# Patient Record
Sex: Male | Born: 1980
Health system: Southern US, Community
[De-identification: ages and names within clinical notes are randomized; demographics above are authoritative.]

## PROBLEM LIST (undated history)

## (undated) DIAGNOSIS — J42 Unspecified chronic bronchitis: Secondary | ICD-10-CM

## (undated) DIAGNOSIS — K824 Cholesterolosis of gallbladder: Secondary | ICD-10-CM

## (undated) DIAGNOSIS — G43909 Migraine, unspecified, not intractable, without status migrainosus: Secondary | ICD-10-CM

## (undated) DIAGNOSIS — N410 Acute prostatitis: Secondary | ICD-10-CM

## (undated) DIAGNOSIS — H00019 Hordeolum externum unspecified eye, unspecified eyelid: Secondary | ICD-10-CM

## (undated) DIAGNOSIS — B2790 Infectious mononucleosis, unspecified without complication: Secondary | ICD-10-CM

## (undated) DIAGNOSIS — J45909 Unspecified asthma, uncomplicated: Secondary | ICD-10-CM

## (undated) DIAGNOSIS — J302 Other seasonal allergic rhinitis: Secondary | ICD-10-CM

## (undated) DIAGNOSIS — K7689 Other specified diseases of liver: Secondary | ICD-10-CM

## (undated) DIAGNOSIS — K760 Fatty (change of) liver, not elsewhere classified: Secondary | ICD-10-CM

## (undated) DIAGNOSIS — E669 Obesity, unspecified: Secondary | ICD-10-CM

## (undated) DIAGNOSIS — F329 Major depressive disorder, single episode, unspecified: Secondary | ICD-10-CM

## (undated) DIAGNOSIS — F32A Depression, unspecified: Secondary | ICD-10-CM

## (undated) HISTORY — PX: WISDOM TOOTH EXTRACTION: SHX21

## (undated) HISTORY — DX: Acute prostatitis: N41.0

## (undated) HISTORY — DX: Unspecified asthma, uncomplicated: J45.909

## (undated) HISTORY — DX: Unspecified chronic bronchitis: J42

## (undated) HISTORY — DX: Infectious mononucleosis, unspecified without complication: B27.90

## (undated) HISTORY — DX: Hordeolum externum unspecified eye, unspecified eyelid: H00.019

## (undated) HISTORY — PX: OTHER SURGICAL HISTORY: SHX169

---
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
## 2001-10-25 ENCOUNTER — Encounter (INDEPENDENT_AMBULATORY_CARE_PROVIDER_SITE_OTHER): Payer: Self-pay | Admitting: Specialist

## 2001-10-25 ENCOUNTER — Ambulatory Visit (HOSPITAL_BASED_OUTPATIENT_CLINIC_OR_DEPARTMENT_OTHER): Admission: RE | Admit: 2001-10-25 | Discharge: 2001-10-25 | Payer: Self-pay | Admitting: Plastic Surgery

## 2005-09-23 ENCOUNTER — Ambulatory Visit: Payer: Self-pay | Admitting: Internal Medicine

## 2006-11-04 ENCOUNTER — Ambulatory Visit: Payer: Self-pay | Admitting: Internal Medicine

## 2006-12-03 ENCOUNTER — Encounter: Payer: Self-pay | Admitting: Internal Medicine

## 2007-11-17 ENCOUNTER — Ambulatory Visit: Payer: Self-pay | Admitting: Internal Medicine

## 2007-11-17 DIAGNOSIS — J42 Unspecified chronic bronchitis: Secondary | ICD-10-CM | POA: Insufficient documentation

## 2007-11-17 DIAGNOSIS — F17200 Nicotine dependence, unspecified, uncomplicated: Secondary | ICD-10-CM | POA: Insufficient documentation

## 2007-11-17 DIAGNOSIS — E669 Obesity, unspecified: Secondary | ICD-10-CM | POA: Insufficient documentation

## 2007-11-21 ENCOUNTER — Telehealth: Payer: Self-pay | Admitting: Internal Medicine

## 2008-02-28 ENCOUNTER — Ambulatory Visit: Payer: Self-pay | Admitting: Internal Medicine

## 2008-05-14 ENCOUNTER — Telehealth (INDEPENDENT_AMBULATORY_CARE_PROVIDER_SITE_OTHER): Payer: Self-pay | Admitting: *Deleted

## 2008-05-15 ENCOUNTER — Ambulatory Visit: Payer: Self-pay | Admitting: Internal Medicine

## 2008-09-07 ENCOUNTER — Telehealth: Payer: Self-pay | Admitting: Internal Medicine

## 2008-10-25 ENCOUNTER — Ambulatory Visit: Payer: Self-pay | Admitting: Internal Medicine

## 2009-04-12 ENCOUNTER — Ambulatory Visit: Payer: Self-pay | Admitting: Internal Medicine

## 2009-04-12 ENCOUNTER — Encounter: Payer: Self-pay | Admitting: Internal Medicine

## 2009-04-12 ENCOUNTER — Telehealth: Payer: Self-pay | Admitting: Internal Medicine

## 2009-04-12 DIAGNOSIS — S59919A Unspecified injury of unspecified forearm, initial encounter: Secondary | ICD-10-CM

## 2009-04-12 DIAGNOSIS — S6990XA Unspecified injury of unspecified wrist, hand and finger(s), initial encounter: Secondary | ICD-10-CM | POA: Insufficient documentation

## 2009-04-12 DIAGNOSIS — S59909A Unspecified injury of unspecified elbow, initial encounter: Secondary | ICD-10-CM

## 2009-11-13 ENCOUNTER — Ambulatory Visit: Payer: Self-pay | Admitting: Internal Medicine

## 2010-02-25 NOTE — Assessment & Plan Note (Signed)
Summary: elbow injury / SD   Vital Signs:  Patient profile:   30 year old male Height:      68 inches (172.72 cm) Weight:      232.75 pounds (105.80 kg) BMI:     35.52 O2 Sat:      98 % on Room air Temp:     97.5 degrees F (36.39 degrees C) oral Pulse rate:   55 / minute Pulse rhythm:   regular Resp:     16 per minute BP sitting:   120 / 78  (left arm) Cuff size:   large  Vitals Entered By: Rock Nephew CMA (April 12, 2009 11:16 AM) Taken by Sydell Axon  Nutrition Counseling: Patient's BMI is greater than 25 and therefore counseled on weight management options.  O2 Flow:  Room air CC: pt c/o pain in R elbow from a fall, limiter ROM Is Patient Diabetic? No   Primary Care Provider:  Jacques Navy MD  CC:  pt c/o pain in R elbow from a fall and limiter ROM.  History of Present Illness:  Injury      This is a 30 year old man who presents with An injury.  The problem began 12-24 hrs ago.  The patient reports injury to the right elbow.  He feel playing kickball and landed on his right elbow. The patient also reports tenderness.  The patient denies swelling, redness, increased warmth deformity, blood loss, numbness, weakness, loss of sensation, coolness of extremity, and loss of consciousness.  The patient denies the following risk factors for significant bleeding: aspirin use, anticoagulant use, and history of bleeding disorder.  Screening for risk of abuse was negative.    Preventive Screening-Counseling & Management  Alcohol-Tobacco     Alcohol drinks/day: 1     Alcohol type: beer     Smoking Status: quit < 6 months     Smoking Cessation Counseling: yes     Packs/Day: .5     Tobacco Counseling: to remain off tobacco products  Medications Prior to Update: 1)  Chantix Starting Month Pak 0.5 Mg X 11 & 1 Mg X 42 Tabs (Varenicline Tartrate) .... As Directed: Two Times A Day Stepping Up The Dose 2)  Chantix Continuing Month Pak 1 Mg Tabs (Varenicline Tartrate) ....  Take As Directed: 1 Tab Two Times A Day.  Current Medications (verified): 1)  Chantix Continuing Month Pak 1 Mg Tabs (Varenicline Tartrate) .... Take As Directed: 1 Tab Two Times A Day.  Allergies (verified): No Known Drug Allergies  Past History:  Past Medical History: Reviewed history from 11/17/2007 and no changes required. Hx of BRONCHITIS, CHRONIC (ICD-491.9)  Past Surgical History: Reviewed history from 11/17/2007 and no changes required. mole excision scalp and arm  Family History: Reviewed history from 11/17/2007 and no changes required. Father - healthy with no active medical problems, CAD, DM, HTN Mother - Crohn's disease, Raynaud's phenomena Neg - prostate or colon cancer, DM  Social History: Reviewed history from 05/15/2008 and no changes required. 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 quit smoking March 2010  Review of Systems MS:  Complains of joint pain and stiffness; denies joint redness, joint swelling, loss of strength, muscle aches, and thoracic pain.  Physical Exam  General:  Well-developed,well-nourished,in no acute distress; alert,appropriate and cooperative throughout examination Neck:  supple, full ROM, no masses, no thyromegaly; no thyroid nodules  or tenderness. no JVD or carotid bruits.   Lungs:  Normal respiratory effort, chest expands symmetrically. Lungs are clear to auscultation, no crackles or wheezes. Heart:  normal rate, regular rhythm, no murmur, and no rub. BLE without edema. normal DP pulses and normal cap refill in all 4 extremities    Msk:  his right elbow has a very small, subtle abrasion just distal to the olecranon process. there is minimal discomfort with palpation and full extension but there is good ROM otherwise. the joint is not swollen and does not have any crepitance. Pulses:  R and L  carotid,radial,femoral,dorsalis pedis and posterior tibial pulses are full and equal bilaterally Extremities:  No clubbing, cyanosis, edema, or deformity noted with normal full range of motion of all joints.   Neurologic:  No cranial nerve deficits noted. Station and gait are normal. Plantar reflexes are down-going bilaterally. DTRs are symmetrical throughout. Sensory, motor and coordinative functions appear intact.   Impression & Recommendations:  Problem # 1:  ELBOW INJURY (ICD-959.3) Assessment New  Orders: T-Elbow Comp Right (73080TC)  Complete Medication List: 1)  Chantix Continuing Month Pak 1 Mg Tabs (Varenicline tartrate) .... Take as directed: 1 tab two times a day.  Patient Instructions: 1)  Please schedule a follow-up appointment in 2 weeks. 2)  Take 650-1000mg  of Tylenol every 4-6 hours as needed for relief of pain or comfort of fever AVOID taking more than 4000mg   in a 24 hour period (can cause liver damage in higher doses). 3)  Take 400-600mg  of Ibuprofen (Advil, Motrin) with food every 4-6 hours as needed for relief of pain or comfort of fever. 4)  You may move around but avoid painful motions. Apply ice to sore area for 20 minutes 3-4 times a day for 2-3 days.

## 2010-02-25 NOTE — Assessment & Plan Note (Signed)
Summary: FLU [facility] Nat[facility] Milch  Nurse Visit   Allergies: No Known Drug Allergies  Orders Added: 1)  Admin 1st Vaccine [90471] 2)  Flu Vaccine 34yrs + [13086] .lbflu     Flu Vaccine Consent Questions     Do you have a history of severe allergic reactions to this vaccine? no    Any prior history of allergic reactions to egg and/or gelatin? no    Do you have a sensitivity to the preservative Thimersol? no    Do you have a past history of Guillan-Barre Syndrome? no    Do you currently have an acute febrile illness? no    Have you ever had a severe reaction to latex? no    Vaccine information given and explained to patient? yes    Are you currently pregnant? no    Lot Number:[id number]   Exp Date:07/26/2010   Site Given  Left Deltoid IM Lanier Prude, [facility])  November 13, 2009 1:57 PM

## 2010-02-25 NOTE — Progress Notes (Signed)
  Phone Note Other Incoming   Summary of Call: Pt has 2 EMR/IDX chart numbers. Emr will be notified to merge the charts. This must be the # used and xray order was put in EMR & IDX. (will also result to this MRN) Initial call taken by: Lamar Sprinkles, CMA,  April 12, 2009 11:33 AM  New Problems: ELBOW INJURY (ICD-959.3)   New Problems: ELBOW INJURY (ICD-959.3)

## 2010-02-25 NOTE — Miscellaneous (Signed)
Summary: [facility]   Imported By: Lester Blair 04/19/2009 09:49:57  _____________________________________________________________________  External Attachment:    Type:   Image     Comment:   External Document

## 2010-06-13 NOTE — Assessment & Plan Note (Signed)
[facility]                             PRIMARY CARE OFFICE NOTE   NAME:[name] [name]                     MRN:          [id number]  DATE:09/23/2005                            DOB:          09-27-1980    [name] [name] is a 30 year old Caucasian male who I saw for routine medical  care.  He has not been seen for some time.  He presents with a several day  history of low-grade fever, cough productive of a yellow, thick phlegm.  Mild shortness of breath.  He does continue to smoke.  He has taken no  medications for this.   REVIEW OF SYSTEMS:  Negative for constitutional, cardiovascular,  respiratory, GI problems except as noted above.   PHYSICAL EXAMINATION:  VITAL SIGNS:  Temperature 99.3, blood pressure  129/81, pulse 88.  Weight 214.  GENERAL APPEARANCE:  A heavy set Caucasian male in no acute distress.  HEENT:  Left TM at the superior anterior aspect was mildly erythematous.  CHEST:  Patient is moving air well.  No rales, wheezes, or rhonchi are  appreciated.  There is no increased work of breathing.   IMPRESSION/PLAN:  Smoker's bronchitis:  Patient is started on azithromycin  500 mg t.i.d., Phenergan w/Codeine 1 teaspoon q.6h. for cough.  Patient is  to use Tylenol for fever.  I have asked him to call [phone] when he is  ready to make this effort.   Patient reports that he has had labs for routine health maintenance within  the last 2-3 years.  I have reminded him that this should be checked every  five years.  I have encouraged him to continue with his exercise program  which right now consists of road-biking.  He will return to see me on an as-  needed basis.                                   Rosalyn Gess Norins, MD   MEN/MedQ  DD:  09/23/2005  DT:  09/23/2005  Job #:  [id number]

## 2010-06-13 NOTE — Op Note (Signed)
   [name] [name], [name] [name]                     ACCOUNT NO.:  [id number]   MEDICAL RECORD NO.:  [id number]                   PATIENT TYPE:  AMB   LOCATION:  [facility]                                  FACILITY:  [facility]   PHYSICIAN:  Alfredia Ferguson, M.D.               DATE OF BIRTH:  1980/12/22   DATE OF PROCEDURE:  10/25/2001  DATE OF DISCHARGE:                                 OPERATIVE REPORT   PREOPERATIVE DIAGNOSIS:  Dysplastic nevus, left anterior parietal scalp.   POSTOPERATIVE DIAGNOSIS:  Dysplastic nevus, left anterior parietal scalp.   PROCEDURE:  Excision, dysplastic nevus, left parietal scalp, excisional  diameter 1 cm.   SURGEON:  Alfredia Ferguson, M.D.   ANESTHESIA:  2% Xylocaine and 1:100,000 epinephrine.   INDICATION FOR PROCEDURE:  This is a 30 year old male with a previous  history of dysplastic nevus on the left parietal scalp.  There is a new  pigmented nevus located just at the anterior edge of the old scar.  It is  about 4-5 mm in front of his old scar and appears to be a separate lesion as  opposed to a recurrent lesion.  It was biopsied by Dr. Nicholas Lose.  It is  recommended that this area be excised with clear margins.   DESCRIPTION OF PROCEDURE:  The area for surgery was shaved.  Skin marks were  placed in elliptical fashion with approximately 3 mm margins.  Local  anesthesia was infiltrated and the area was prepped with Betadine and draped  with sterile drapes.  After waiting approximately 10 minutes, an elliptical  excision of the lesion down to the level of the galea was carried out.  The  specimen was passed off for pathology.  Hemostasis was accomplished using  pressure.  The wound was closed using multiple interrupted 3-0 nylon  sutures.  The patient was discharged to home in the care of his mother and  father.                                               Alfredia Ferguson, M.D.    WBB/MEDQ  D:  10/25/2001  T:  10/26/2001  Job:  [id number]   cc:   Venancio Poisson, M.D.

## 2011-04-15 IMAGING — CR DG ELBOW COMPLETE 3+V*R*
4 series · 4 of 4 positions shown · non-contrast
Comparison: None.

CLINICAL DATA: Elbow injury last night

RIGHT ELBOW - COMPLETE 3+ VIEW

[view not recorded (1 of 4)]
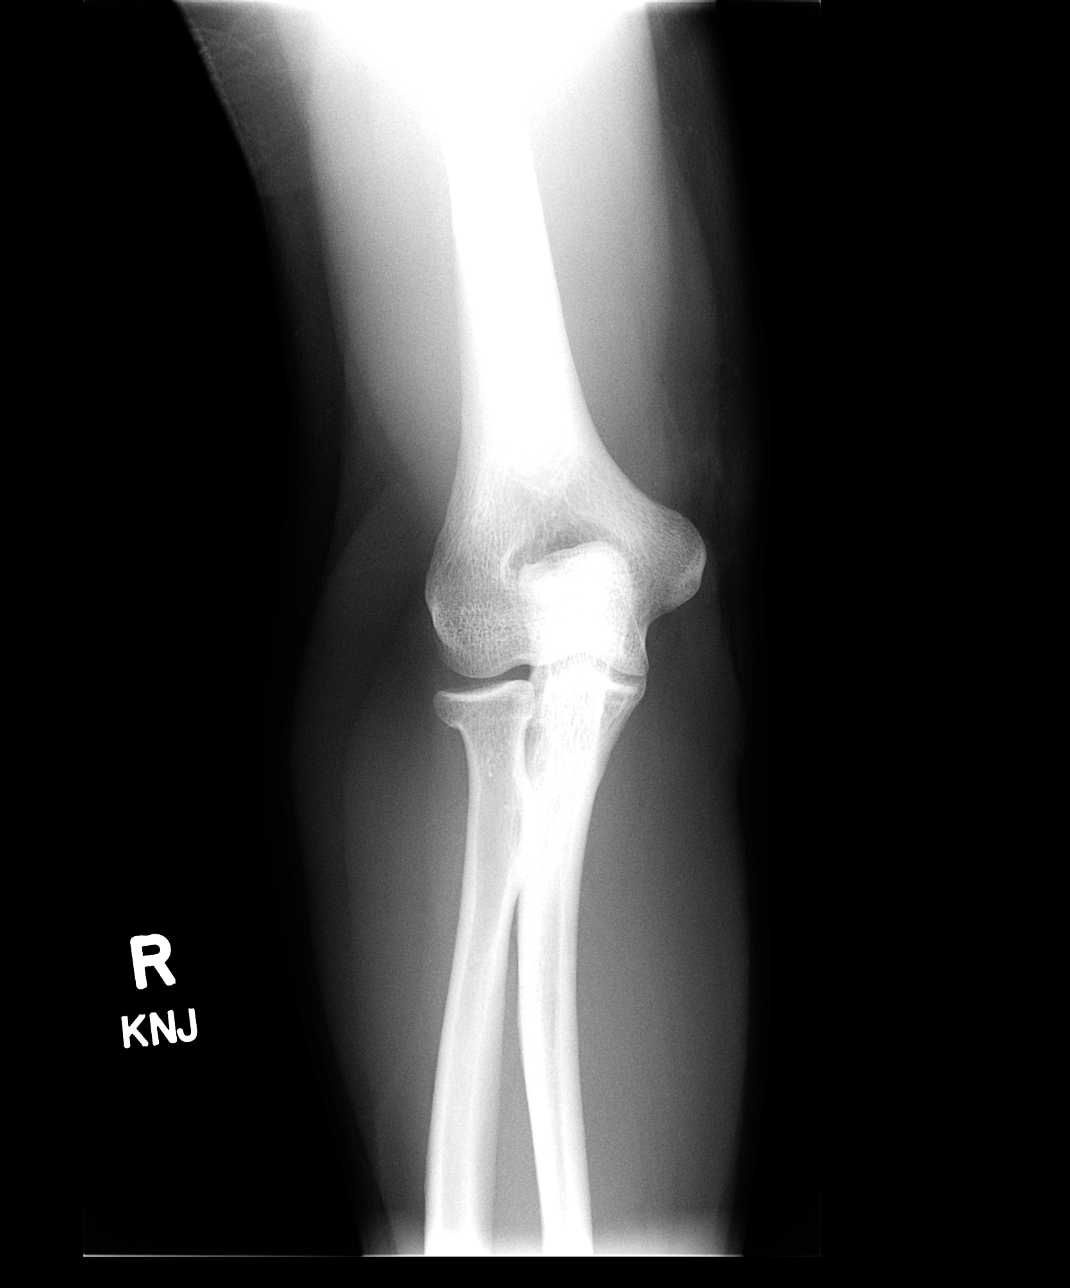

[view not recorded (2 of 4)]
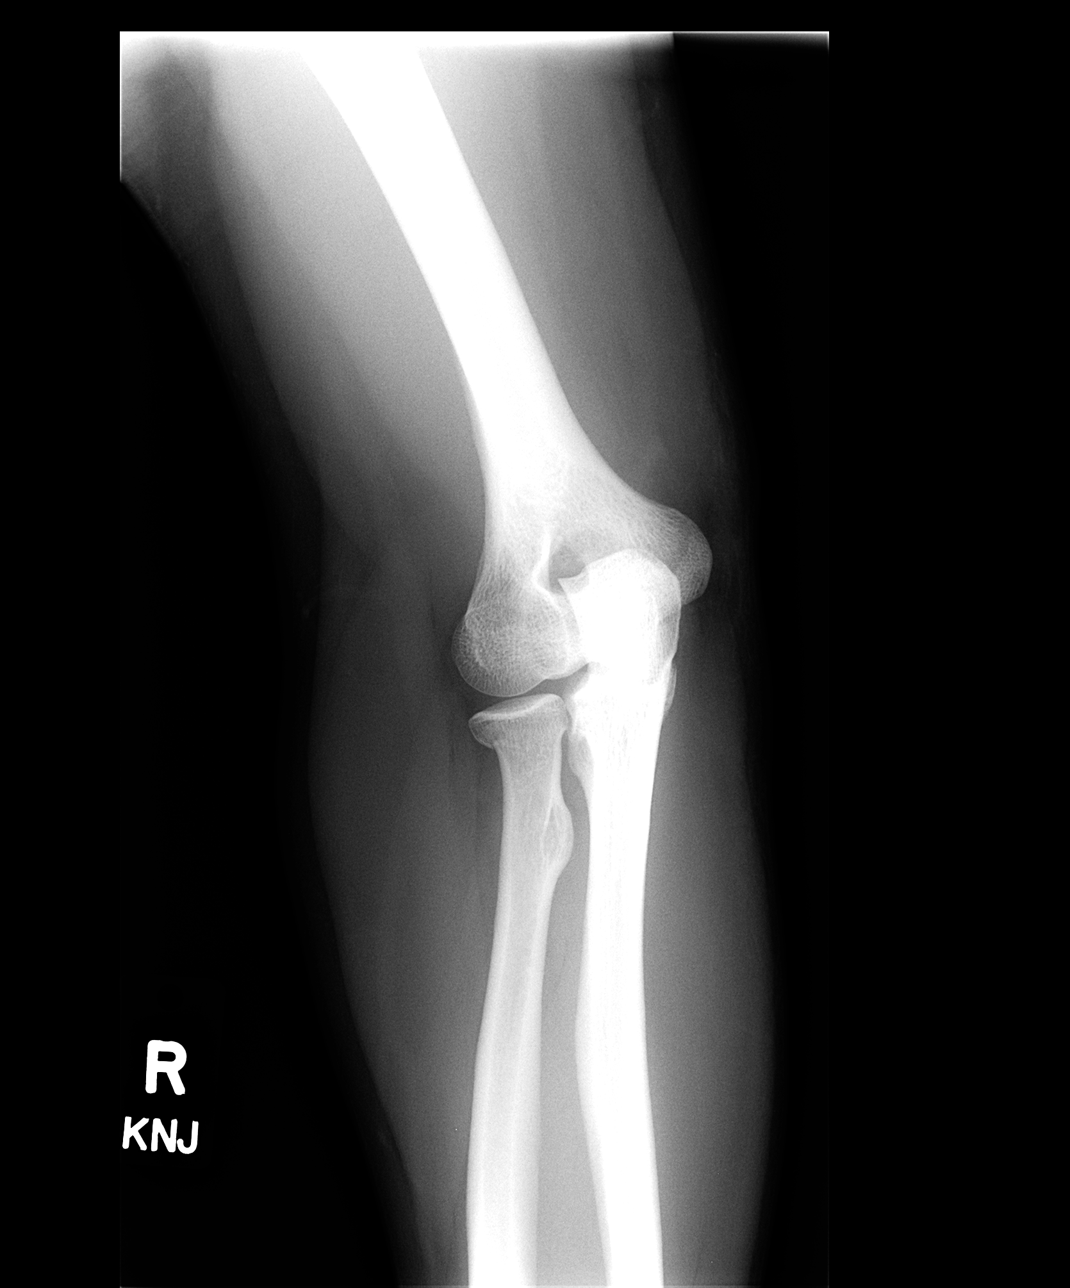

[view not recorded (3 of 4)]
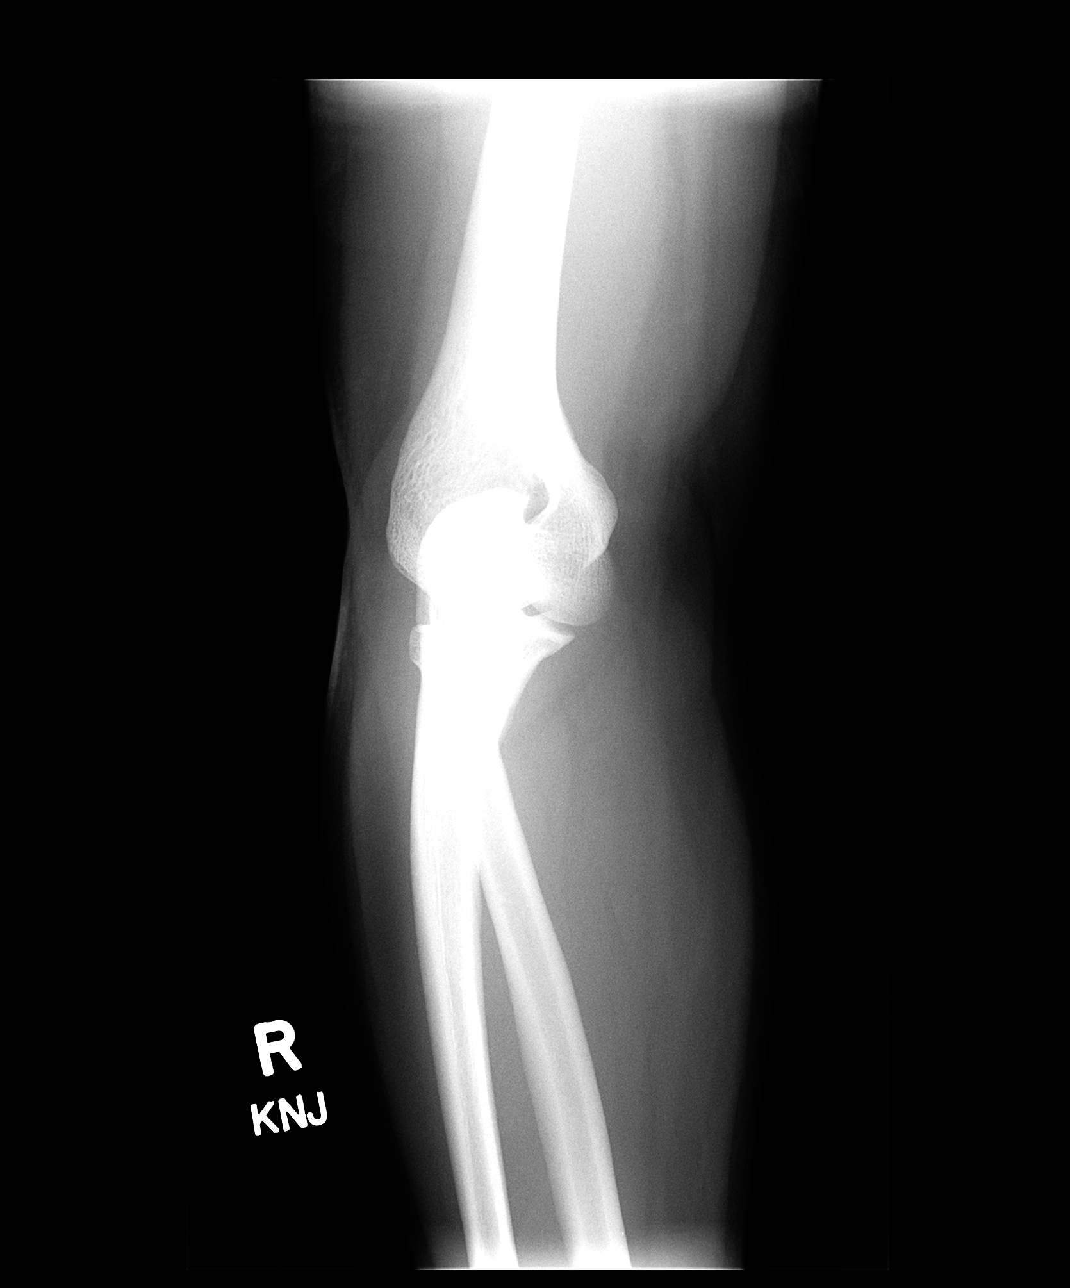

[view not recorded (4 of 4)]
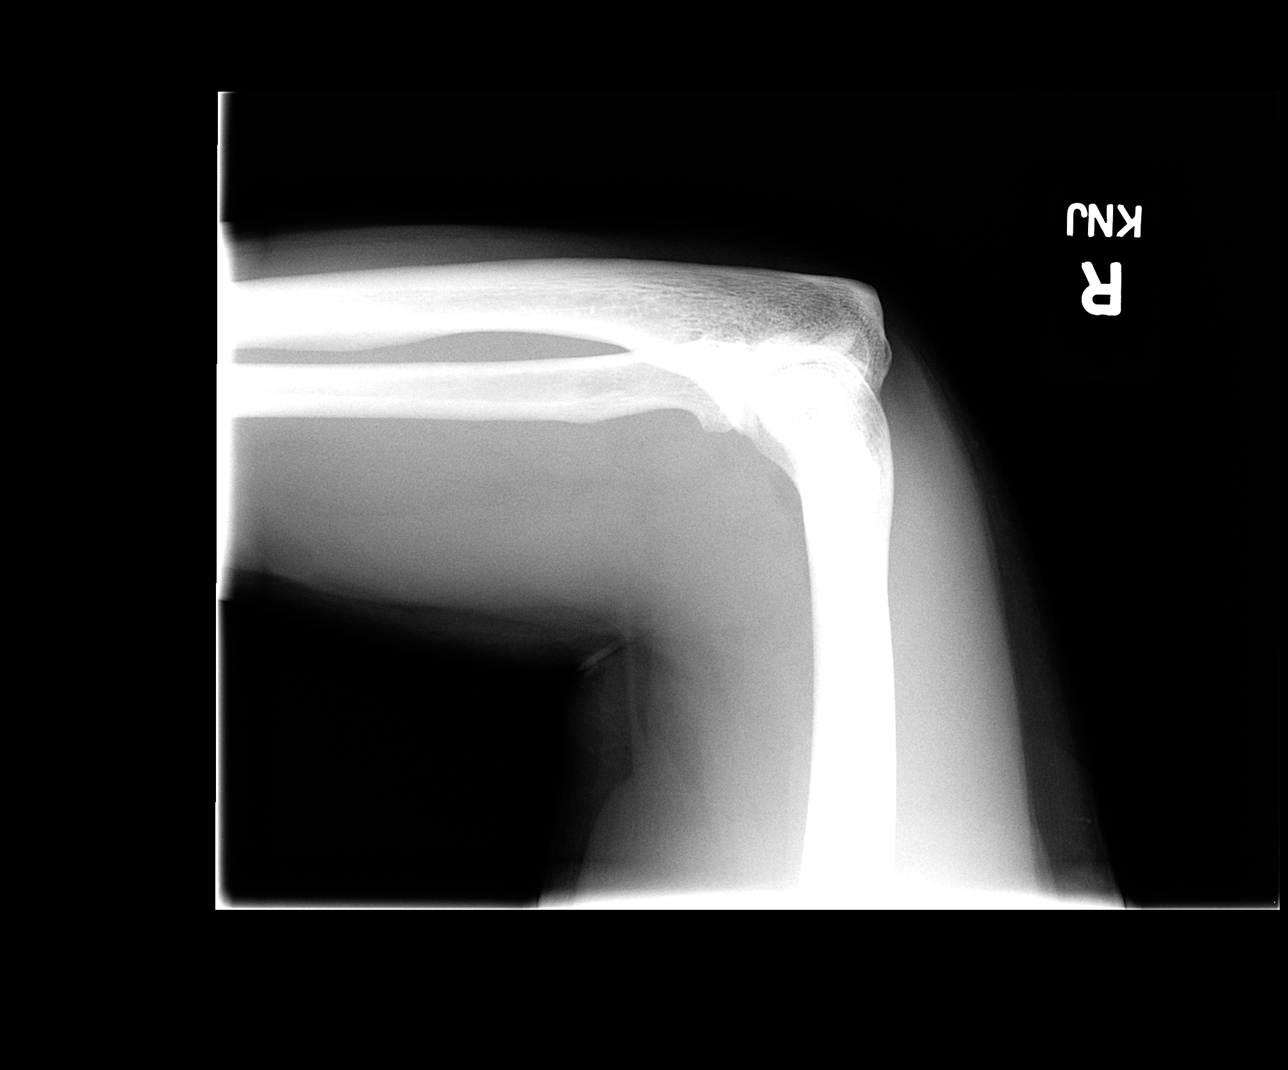

[4 of 4 positions shown; findings below may reference images not displayed]

FINDINGS: Four views of the right elbow submitted.  No acute
fracture or subluxation.  No posterior fat pad sign.
IMPRESSION: No acute fracture or subluxation.

## 2011-04-22 ENCOUNTER — Encounter: Payer: Self-pay | Admitting: Internal Medicine

## 2011-04-22 ENCOUNTER — Telehealth: Payer: Self-pay | Admitting: *Deleted

## 2011-04-22 DIAGNOSIS — N410 Acute prostatitis: Secondary | ICD-10-CM

## 2011-04-22 MED ORDER — HYDROCODONE-ACETAMINOPHEN 5-325 MG PO TABS: 1.0000 | ORAL_TABLET | Freq: Four times a day (QID) | ORAL | Status: AC | PRN

## 2011-04-22 MED ORDER — PROMETHAZINE HCL 25 MG RE SUPP: 25.0000 mg | Freq: Four times a day (QID) | RECTAL | Status: AC | PRN

## 2011-04-22 MED ORDER — SULFAMETHOXAZOLE-TRIMETHOPRIM 800-160 MG PO TABS: 1.0000 | ORAL_TABLET | Freq: Two times a day (BID) | ORAL | Status: AC

## 2011-04-22 MED ORDER — PROMETHAZINE HCL 12.5 MG PO TABS: 12.5000 mg | ORAL_TABLET | Freq: Three times a day (TID) | ORAL | Status: AC | PRN

## 2011-04-22 NOTE — Telephone Encounter (Signed)
Patient c/o abdominal pain x5 hours and requesting OV for tomorrow/SLS

## 2011-04-22 NOTE — Patient Instructions (Signed)
Acute prostatitis - classic presentation. Plan - septra DS twice a day for 14 days; for nausea phenergan 25 mg suppository every 6 hours, once the nausea is controlled take oral phenergan 12.5 mg every 6 hours until nausea is gone and you can take it as needed. For pain - will start with hydrocodone/APAP 5/325 every 6 hours for two or three doses then try tylenol alone 1000mg  three times a day. Clear liquids, eat when you feel better. Call if you don't do better with this regimen   [phone].   Blood pressure was way too high - 180/100. This needs close follow up.  Prostatitis Prostatitis is an inflammation (the body's way of reacting to injury and/or infection) of the prostate gland. The prostate gland is a male organ. The gland is about the size and shape of a walnut. The prostate is located just below the bladder. It produces semen, which is a fluid that helps nourish and transport sperm. Prostatitis is the most common urinary tract problem in men younger than age 41. There are 4 categories of prostatitis:  I - Acute bacterial prostatitis.   II - Chronic bacterial prostatitis.   III - Chronic prostatitis and chronic pelvic pain syndrome (CPPS).   Inflammatory.   Non inflammatory.   IV - Asymptomatic inflammatory prostatitis.  Acute and chronic bacterial prostatitis are problems with bacterial infections of the prostate. "Acute" infection is usually a one-time problem. "Chronic" bacterial prostatitis is a condition with recurrent infection. It is usually caused by the same germ(bacteria). CPPS has symptoms similar to prostate infection. However, no infection is actually found. This condition can cause problems of ongoing pain. Currently, it cannot be cured. Treatments are available and aimed at symptom control.   Asymptomatic inflammatory prostatitis has no symptoms. It is a condition where infection-fighting cells are found by chance in the urine. The diagnosis is made most often during an  exam for other conditions. Other conditions could be infertility or a high level of PSA (prostate-specific antigen) in the blood. SYMPTOMS   Symptoms can vary depending upon the type of prostatitis that exists. There can also be overlap in symptoms. This can make diagnosis difficult. Symptoms: For Acute bacterial prostatitis  Painful urination.   Fever or chills.   Muscle or joint pains.   Low back pain.   Low abdominal pain.   Inability to empty bladder completely.   Sudden urges to urinate.   Frequent urination during the day.   Difficulty starting urine stream.   Need to urinate several times at night (nocturia).   Weak urine stream.   Urethral (tube that carries urine from the bladder out of the body) discharge and dribbling after urination.  For Chronic bacterial prostatitis  Rectal pain.   Pain in the testicles, penis, or tip of the penis.   Pain in the space between the anus and scrotum (perineum).   Low back pain.   Low abdominal pain.   Problems with sexual function.   Painful ejaculation.   Bloody semen.   Inability to empty bladder completely.   Painful urination.   Sudden urges to urinate.   Frequent urination during the day.   Difficulty starting urine stream.   Need to urinate several times at night (nocturia).   Weak urine stream.   Dribbling after urination.   Urethral discharge.  For Chronic prostatitis and chronic pelvic pain syndrome (CPPS) Symptoms are the same as those for chronic bacterial prostatitis. Problems with sexual function are often the reason  for seeking care. This important problem should be discussed with your caregiver. For Asymptomatic inflammatory prostatitis As noted above, there are no symptoms with this condition. DIAGNOSIS    Your caregiver may perform a rectal exam. This exam is to determine if the prostate is swollen and tender.   Sometimes blood work is performed. This is done to see if your white blood  cell count is elevated. The Prostate Specific Antigen (PSA) is also measured. PSA is a blood test that can help detect early prostate cancer.   A urinalysis is done to find out what type of infection is present if this is a suspected cause. An additional urinalysis may be done after a digital rectal exam. This is to see if white blood cells are pushed out of the prostate and into the urine. A low-grade infection of the prostate may not be found on the first urinalysis.  In more difficult cases, your caregiver may advise other tests. Tests could include:  Urodynamics -- Tests the function of the bladder and the organs involved in triggering and controlling normal urination.   Urine flow rate.   Cystoscopy -- In this procedure, a thin, telescope-like tube with a light and tiny camera attached (cystoscope) is inserted into the bladder through the urethra. This allows the caregiver to see the inside of the urethra and bladder.   Electromyography -- This procedure tests how the muscles and nerves of the bladder work. It is focused on the muscles that control the anus and pelvic floor. These are the muscles between the anus and scrotum.  In people who show no signs of infection, certain uncommon infections might be causing constant or recurrent symptoms. These uncommon infections are difficult to detect. More work in medicine may help find solutions to these problems. TREATMENT   Antibiotics are used to treat infections caused by germs. If the infection is not treated and becomes long lasting (chronic), it may become a lower grade infection with minor, continual problems. Without treatment, the prostate may develop a boil or furuncle (abscess). This may require surgical treatment. For those with chronic prostatitis and CPPS, it is important to work closely with your primary caregiver and urologist. For some, the medicines that are used to treat a non-cancerous, enlarged prostate (benign prostatic  hypertrophy) may be helpful. Referrals to specialists other than urologists may be necessary. In rare cases when all treatments have been inadequate for pain control, an operation to remove the prostate may be recommended. This is very rare and before this is considered thorough discussion with your urologist is highly recommended.   In cases of secondary to chronic non-bacterial prostatitis, a good relationship with your urologist or primary caregiver is essential because it is often a recurrent prolonged condition that requires a good understanding of the causes and a commitment to therapy aimed at controlling your symptoms. HOME CARE INSTRUCTIONS    Hot sitz baths for 20 minutes, 4 times per day, may help relieve pain.   Non-prescription pain killers may be used as your caregiver recommends if you have no allergies to them. Some illnesses or conditions prevent use of non-prescription drugs. If unsure, check with your caregiver. Take all medications as directed. Take the antibiotics for the prescribed length of time, even if you are feeling better.  SEEK MEDICAL CARE IF:    You have any worsening of the symptoms that originally brought you to your caregiver.   You have an oral temperature above 102 F (38.9 C).  You experience any side effects from medications prescribed.  SEEK IMMEDIATE MEDICAL CARE IF:    You have an oral temperature above 102 F (38.9 C), not controlled by medicine.   You have pain not relieved with medications.   You develop nausea, vomiting, lightheadedness, or have a fainting episode.   You are unable to urinate.   You pass bloody urine or clots.  Document Released: 01/10/2000 Document Revised: 01/01/2011 Document Reviewed: 12/15/2010 [facility] Patient Information 2012 [place], [place].

## 2011-04-22 NOTE — Telephone Encounter (Signed)
Called patient - told to come to office now.

## 2011-04-23 NOTE — Progress Notes (Signed)
  Subjective:    Patient ID: [name] [name], male    DOB: 08-03-1980, 31 y.o.   MRN: [id number]  HPI Mr. Years reports the onset mid-day of lower abdominal/groin pain, ache, chills, low back pain and nausea. This has been accelerating to the point where he rates his pain as a 8/10. He is asked to come to the office for evaluation.  Past Medical History: Reviewed history from 11/17/2007 and no changes required. Hx of BRONCHITIS, CHRONIC (ICD-491.9)  Past Surgical History: Reviewed history from 11/17/2007 and no changes required. mole excision scalp and arm  Family History: Reviewed history from 11/17/2007 and no changes required. Father - healthy with no active medical problems, CAD, DM, HTN Mother - Crohn's disease, Raynaud's phenomena Neg - prostate or colon cancer, DM  Social History: Reviewed history from 05/15/2008 and no changes required. 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 quit smoking March 2010     Review of Systems System review is negative for any constitutional, cardiac, pulmonary, GI or neuro symptoms or complaints other than as described in the HPI.     Objective:   Physical Exam t 98.3  BP 180/100  HR 80 R 14 Gen'l - overweight white man who is a little pale and looks sick HEENT - C&S clear Cor - 2+ radial , RRR Pulm - normal respirations - no wheeze Abd- BS+, no guarding on exam GU - low back tenderness to percussion, very tender to palpation in the lower abdominal quadrants, Prostate - very warm and boggy and tender.       Assessment & Plan:  Acute prostatitis  Plan - Septra DS bid x 14 days           Phenergan supp or tab q 6 prn           Vicodin 5/325 q 6 # 4           APAP 1000 mg tid           Hysdrate.

## 2011-11-02 ENCOUNTER — Other Ambulatory Visit (INDEPENDENT_AMBULATORY_CARE_PROVIDER_SITE_OTHER): Payer: 59

## 2011-11-02 ENCOUNTER — Encounter: Payer: Self-pay | Admitting: Internal Medicine

## 2011-11-02 ENCOUNTER — Ambulatory Visit (INDEPENDENT_AMBULATORY_CARE_PROVIDER_SITE_OTHER): Payer: 59 | Admitting: Internal Medicine

## 2011-11-02 VITALS — BP 106/68 | HR 60 | Temp 98.2°F | Resp 16 | Wt 229.0 lb

## 2011-11-02 DIAGNOSIS — Z0000 Encounter for general adult medical examination without abnormal findings: Secondary | ICD-10-CM

## 2011-11-02 DIAGNOSIS — Z23 Encounter for immunization: Secondary | ICD-10-CM

## 2011-11-02 DIAGNOSIS — E663 Overweight: Secondary | ICD-10-CM

## 2011-11-02 DIAGNOSIS — Z720 Tobacco use: Secondary | ICD-10-CM

## 2011-11-02 DIAGNOSIS — F17200 Nicotine dependence, unspecified, uncomplicated: Secondary | ICD-10-CM

## 2011-11-02 LAB — LIPID PANEL
Cholesterol: 173 mg/dL (ref 0–200)
HDL: 36.7 mg/dL — ABNORMAL LOW (ref >39.00)
LDL Cholesterol: 102 mg/dL — ABNORMAL HIGH (ref 0–99)
Triglycerides: 170 mg/dL — ABNORMAL HIGH (ref 0.0–149.0)
VLDL: 34 mg/dL (ref 0.0–40.0)

## 2011-11-02 LAB — COMPREHENSIVE METABOLIC PANEL
AST: 34 U/L (ref 0–37)
Alkaline Phosphatase: 58 U/L (ref 39–117)
BUN: 13 mg/dL (ref 6–23)
Creatinine, Ser: 1 mg/dL (ref 0.4–1.5)

## 2011-11-02 MED ORDER — VARENICLINE TARTRATE 0.5 MG PO TABS: 0.5000 mg | ORAL_TABLET | Freq: Two times a day (BID) | ORAL | Status: DC

## 2011-11-02 MED ORDER — VARENICLINE TARTRATE 1 MG PO TABS: 1.0000 mg | ORAL_TABLET | Freq: Two times a day (BID) | ORAL | Status: DC

## 2011-11-02 NOTE — Patient Instructions (Addendum)
You look like you are doing well.   Smoking cessation - THE MOST IMPORTANT THING YOU CAN DO FOR YOUR HEALTH. It is imperative that you do the home work first: you have got to know those times when the temptation to smoke will be the greatest and you need to have an alternative behavior. Once the homework is done and you set the quit date you can start the Chantix 2 weeks before stopping.

## 2011-11-02 NOTE — Progress Notes (Signed)
Subjective:    Patient ID: [name] [name], male    DOB: 1980/10/21, 31 y.o.   MRN: [id number]  HPI [name] [name] presents for a wellness exam. In the interval since last seen his prostatitis completely resolved. He did sprain his right ankle 5 days ago but is doing betting and is able to bear weight and ambulate. His wife is expecting and he is more determined to stop smoking - currently at 3/4 pack/day. He is otherwise in good health. He works out with a Psychologist, educational 2 days a week. He is current with his dentist and has had an eye exam in the last 3 years.   Past Medical History  Diagnosis Date  . Unspecified chronic bronchitis     long resolved. ? related to smoking  . Prostatitis, acute Spring '13    resolved  . Current smoker Oct '13    motivated to quit   Past Surgical History  Procedure Date  . Mole excision    Family History  Problem Relation Age of Onset  . Crohn's disease Mother    History   Social History  . Marital Status: Married    Spouse Name: N/A    Number of Children: N/A  . Years of Education: 15   Occupational History  . property developmen/construction    Social History Main Topics  . Smoking status: Current Every Day Smoker  . Smokeless tobacco: Former Neurosurgeon   Comment: smoking cessation: diary, behavior change plan, quit  . Alcohol Use: 7.0 oz/week    14 drink(s) per week     1-2 beers per day  . Drug Use: No  . Sexually Active: Yes -- Male partner(s)   Other Topics Concern  . 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 QUIT SMOKING MARCH 2010 but RESUMED       Review of Systems Constitutional:  Negative for fever, chills, activity change and unexpected weight change.  HEENT:  Negative for hearing loss, ear pain, congestion, neck stiffness and postnasal drip.  Negative for sore throat or swallowing problems. Negative for dental complaints.   Eyes: Negative for vision loss or change in visual acuity.  Respiratory: Negative for chest tightness and wheezing. Negative for DOE.   Cardiovascular: Negative for chest pain or palpitations. No decreased exercise tolerance Gastrointestinal: No change in bowel habit. No bloating or gas. No reflux or indigestion Genitourinary: Negative for urgency, frequency, flank pain and difficulty urinating.  Musculoskeletal: Negative for myalgias, back pain, arthralgias and gait problem. Painful right ankle Neurological: Negative for dizziness, tremors, weakness and headaches.  Hematological: Negative for adenopathy.  Psychiatric/Behavioral: Negative for behavioral problems and dysphoric mood.       Objective:   Physical Exam Filed Vitals:   11/02/11 0950  BP: 106/68  Pulse: 60  Temp: 98.2 F (36.8 C)  Resp: 16   Wt Readings from Last 3 Encounters:  11/02/11 229 lb (103.874 kg)  04/12/09 232 lb 12 oz (105.575 kg)  05/15/08 228 lb 9.6 oz (103.692 kg)   Gen'l: Well nourished well developed white male in no acute distress  HEENT: Head: Normocephalic and atraumatic. Right Ear: External ear normal. EAC/TM nl. Left Ear: External ear normal.  EAC/TM nl. Nose: Nose normal. Mouth/Throat: Oropharynx is clear and moist. Dentition - native, in good repair. No  buccal or palatal lesions. Posterior pharynx clear. Eyes: Conjunctivae and sclera clear. EOM intact. Pupils are equal, round, and reactive to light. Right eye exhibits no discharge. Left eye exhibits no discharge. Neck: Normal range of motion. Neck supple. No JVD present. No tracheal deviation present. No thyromegaly present.  Cardiovascular: Normal rate, regular rhythm, no gallop, no friction rub, no murmur heard.      Quiet precordium. 2+ radial and DP pulses . No carotid bruits Pulmonary/Chest: Effort normal. No respiratory distress or increased WOB, no wheezes, no  rales. No chest wall deformity or CVAT. Abdomen: Soft. Bowel sounds are normal in all quadrants. He exhibits no distension, no tenderness, no rebound or guarding, No heptosplenomegaly  Genitourinary:  deferred Musculoskeletal: Normal range of motion, including right ankle. He exhibits no edema and no tenderness except at the right ankle.       Small and large joints without redness, synovial thickening or deformity. Full range of motion preserved about all small, median and large joints.  Lymphadenopathy:    He has no cervical or supraclavicular adenopathy.  Neurological: He is alert and oriented to person, place, and time. CN II-XII intact. DTRs 2+ and symmetrical biceps, radial and patellar tendons. Cerebellar function normal with no tremor, rigidity, normal gait and station.  Skin: Skin is warm and dry. No rash noted. No erythema.  Psychiatric: He has a normal mood and affect. His behavior is normal. Thought content normal.   Lab Results  Component Value Date   GLUCOSE 84 11/02/2011   CHOL 173 11/02/2011   TRIG 170.0* 11/02/2011   HDL 36.70* 11/02/2011   LDLCALC 102* 11/02/2011   ALT 51 11/02/2011   AST 34 11/02/2011   NA 140 11/02/2011   K 4.7 11/02/2011   CL 105 11/02/2011   CREATININE 1.0 11/02/2011   BUN 13 11/02/2011   CO2 28 11/02/2011           Assessment & Plan:

## 2011-11-03 ENCOUNTER — Encounter: Payer: Self-pay | Admitting: Internal Medicine

## 2011-11-03 DIAGNOSIS — Z0000 Encounter for general adult medical examination without abnormal findings: Secondary | ICD-10-CM | POA: Insufficient documentation

## 2011-11-03 NOTE — Assessment & Plan Note (Signed)
BMI in obesity I category. He is advised to work on [employer] with a goal of BMI 25 through smart food choice, PORTION SIZE CONTROL, regular exercise. Goal is to loose 1 lb per month

## 2011-11-03 NOTE — Assessment & Plan Note (Addendum)
Interval history negative for major illness, except prostatitis, injury or surgery. Physical exam is normal. Lab work is fine with mild elevation in triglycerides amenable to diet therapy alone. He is given Tdap today.  In summary - a very nice young man who is medically stable but needs to 1) quit smoking, 2) loose weight.

## 2011-11-03 NOTE — Assessment & Plan Note (Signed)
Discussed, again, a strategy for smoking cessation: diary, plan for behavior modification, quit date. Provided Rx for Chantix started pack and monthly pack to start once he has done his homework. He is to report back when he has prepared to [facility]

## 2011-11-30 ENCOUNTER — Encounter: Payer: Self-pay | Admitting: Internal Medicine

## 2011-12-04 ENCOUNTER — Telehealth: Payer: Self-pay | Admitting: *Deleted

## 2011-12-04 NOTE — Telephone Encounter (Signed)
Message routed to [facility]

## 2011-12-08 NOTE — Telephone Encounter (Signed)
Letter printed and placed on MD's desk for review

## 2012-06-13 ENCOUNTER — Ambulatory Visit (INDEPENDENT_AMBULATORY_CARE_PROVIDER_SITE_OTHER): Payer: 59 | Admitting: Internal Medicine

## 2012-06-13 ENCOUNTER — Encounter: Payer: Self-pay | Admitting: Internal Medicine

## 2012-06-13 VITALS — BP 128/62 | HR 82 | Temp 98.3°F | Ht 68.0 in | Wt 231.0 lb

## 2012-06-13 DIAGNOSIS — J209 Acute bronchitis, unspecified: Secondary | ICD-10-CM

## 2012-06-13 DIAGNOSIS — G43909 Migraine, unspecified, not intractable, without status migrainosus: Secondary | ICD-10-CM

## 2012-06-13 MED ORDER — AZITHROMYCIN 250 MG PO TABS: ORAL_TABLET | ORAL | Status: DC

## 2012-06-13 MED ORDER — KETOROLAC TROMETHAMINE 30 MG/ML IJ SOLN
30.0000 mg | Freq: Once | INTRAMUSCULAR | Status: AC
  Administered 2012-06-13: 30 mg via INTRAMUSCULAR

## 2012-06-13 NOTE — Progress Notes (Signed)
Subjective:    Patient ID: [name] [name], male    DOB: Jun 16, 1980, 32 y.o.   MRN: [id number]  HPI  Pt present sto the clinic today with c/o a migraine headache. This started 4 days ago. The pain varies to different sides of the head. He is sensitive to light but not sound. He has been able to sleep but it has not gone away. He has been taking Excedrin and ibuprofen. He has no history of migraines in the past. He has had some associated nausea but no vomiting. He also c/o fatigue, cough, chest congestion. He denies fever chills or body aches. He has not smoked in 37 days. He feels like he has a recurrent bout of bronchitis. This has been an intermittent thing for him since he has been smoking.  Review of Systems      Past Medical History  Diagnosis Date  . Unspecified chronic bronchitis     long resolved. ? related to smoking  . Prostatitis, acute Spring '13    resolved  . Current smoker Oct '13    motivated to quit    Current Outpatient Prescriptions  Medication Sig Dispense Refill  . varenicline (CHANTIX CONTINUING MONTH PAK) 1 MG tablet Take 1 tablet (1 mg total) by mouth 2 (two) times daily.  60 tablet  6  . varenicline (CHANTIX) 0.5 MG tablet Take 1 tablet (0.5 mg total) by mouth 2 (two) times daily. As directed: 1 tab qd x 3, 1 0.5 mg tab bid x 4, 1 mg bid  53 tablet  0   No current facility-administered medications for this visit.    No Known Allergies  Family History  Problem Relation Age of Onset  . Crohn's disease Mother     History   Social History  . Marital Status: Married    Spouse Name: N/A    Number of Children: N/A  . Years of Education: 15   Occupational History  . property developmen/construction    Social History Main Topics  . Smoking status: Current Every Day Smoker  . Smokeless tobacco: Former Neurosurgeon     Comment: smoking cessation: diary, behavior change plan, quit  . Alcohol Use: 7.0 oz/week    14 drink(s) per week     Comment: 1-2 beers  per day  . Drug Use: No  . Sexually Active: Yes -- Male partner(s)   Other Topics Concern  . 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 QUIT SMOKING MARCH 2010 but RESUMED     Constitutional: Pt reports migraine. Denies fever, malaise, fatigue, headache or abrupt weight changes.  HEENT: Denies eye pain, eye redness, ear pain, ringing in the ears, wax buildup, runny nose, nasal congestion, bloody nose, or sore throat. Resperiatory: Pt reports cough and chest congestion. Denies shortness of breath. Gastrointestinal: Denies abdominal pain, bloating, constipation, diarrhea or blood in the stool.   Neurological: Denies dizziness, difficulty with memory, difficulty with speech or problems with balance and coordination.   No other specific complaints in a complete review of systems (except as listed in HPI above).  Objective:   Physical Exam   BP 128/62  Pulse 82  Temp(Src) 98.3 F (36.8 C) (Oral)  Ht 5\' 8"  (1.727 m)  Wt 231 lb (104.781 kg)  BMI 35.13  kg/m2  SpO2 96% Wt Readings from Last 3 Encounters:  06/13/12 231 lb (104.781 kg)  11/02/11 229 lb (103.874 kg)  04/12/09 232 lb 12 oz (105.575 kg)    General: Appears his stated age, well developed, well nourished in NAD. HEENT: Head: normal shape and size; Eyes: sclera white, no icterus, conjunctiva pink, PERRLA and EOMs intact; Ears: Tm's gray and intact, normal light reflex; Nose: mucosa pink and moist, septum midline; Throat/Mouth: Teeth present, mucosa pink and moist, no exudate, lesions or ulcerations noted.  Cardiovascular: Normal rate and rhythm. S1,S2 noted.  No murmur, rubs or gallops noted. No JVD or BLE edema. No carotid bruits noted. Pulmonary/Chest: Normal effort and scattered rhonchi in RML and RLL. No respiratory distress. No  wheezes, rales noted.  Abdomen: Soft and nontender. Normal bowel sounds, no bruits noted. No distention or masses noted. Liver, spleen and kidneys non palpable. Musculoskeletal: Normal range of motion. No signs of joint swelling. No difficulty with gait.  Neurological: Alert and oriented. Cranial nerves II-XII intact. Coordination normal. +DTRs bilaterally.       Assessment & Plan:   Migraine secondary to acute bronchitis:  eRx for zpack Delsym and mucinex as needed 30 mg Toradol Im today

## 2012-06-13 NOTE — Addendum Note (Signed)
Addended by: Carin Primrose on: 06/13/2012 04:20 PM   Modules accepted: Orders

## 2012-06-13 NOTE — Patient Instructions (Signed)
Migraine Headache A migraine headache is an intense, throbbing pain on one or both sides of your head. A migraine can last for 30 minutes to several hours. CAUSES  The exact cause of a migraine headache is not always known. However, a migraine may be caused when nerves in the brain become irritated and release chemicals that cause inflammation. This causes pain. SYMPTOMS  Pain on one or both sides of your head.  Pulsating or throbbing pain.  Severe pain that prevents daily activities.  Pain that is aggravated by any physical activity.  Nausea, vomiting, or both.  Dizziness.  Pain with exposure to bright lights, loud noises, or activity.  General sensitivity to bright lights, loud noises, or smells. Before you get a migraine, you may get warning signs that a migraine is coming (aura). An aura may include:  Seeing flashing lights.  Seeing bright spots, halos, or zig-zag lines.  Having tunnel vision or blurred vision.  Having feelings of numbness or tingling.  Having trouble talking.  Having muscle weakness. MIGRAINE TRIGGERS  Alcohol.  Smoking.  Stress.  Menstruation.  Aged cheeses.  Foods or drinks that contain nitrates, glutamate, aspartame, or tyramine.  Lack of sleep.  Chocolate.  Caffeine.  Hunger.  Physical exertion.  Fatigue.  Medicines used to treat chest pain (nitroglycerine), birth control pills, estrogen, and some blood pressure medicines. DIAGNOSIS  A migraine headache is often diagnosed based on:  Symptoms.  Physical examination.  A CT scan or MRI of your head. TREATMENT Medicines may be given for pain and nausea. Medicines can also be given to help prevent recurrent migraines.  HOME CARE INSTRUCTIONS  Only take over-the-counter or prescription medicines for pain or discomfort as directed by your caregiver. The use of long-term narcotics is not recommended.  Lie down in a dark, quiet room when you have a migraine.  Keep a journal  to find out what may trigger your migraine headaches. For example, write down:  What you eat and drink.  How much sleep you get.  Any change to your diet or medicines.  Limit alcohol consumption.  Quit smoking if you smoke.  Get 7 to 9 hours of sleep, or as recommended by your caregiver.  Limit stress.  Keep lights dim if bright lights bother you and make your migraines worse. SEEK IMMEDIATE MEDICAL CARE IF:   Your migraine becomes severe.  You have a fever.  You have a stiff neck.  You have vision loss.  You have muscular weakness or loss of muscle control.  You start losing your balance or have trouble walking.  You feel faint or pass out.  You have severe symptoms that are different from your first symptoms. MAKE SURE YOU:   Understand these instructions.  Will watch your condition.  Will get help right away if you are not doing well or get worse. Document Released: 01/12/2005 Document Revised: 04/06/2011 Document Reviewed: 01/02/2011 [employer] Patient Information 2013 [employer].  

## 2012-06-17 ENCOUNTER — Encounter: Payer: Self-pay | Admitting: Internal Medicine

## 2012-06-17 ENCOUNTER — Ambulatory Visit (INDEPENDENT_AMBULATORY_CARE_PROVIDER_SITE_OTHER): Payer: 59 | Admitting: Internal Medicine

## 2012-06-17 ENCOUNTER — Ambulatory Visit (INDEPENDENT_AMBULATORY_CARE_PROVIDER_SITE_OTHER)
Admission: RE | Admit: 2012-06-17 | Discharge: 2012-06-17 | Disposition: A | Discharge status: Home / Self Care | Payer: 59 | Source: Ambulatory Visit | Attending: Internal Medicine | Admitting: Internal Medicine

## 2012-06-17 VITALS — BP 128/88 | HR 80 | Temp 98.0°F | Resp 16 | Wt 228.0 lb

## 2012-06-17 DIAGNOSIS — R112 Nausea with vomiting, unspecified: Secondary | ICD-10-CM

## 2012-06-17 DIAGNOSIS — G43909 Migraine, unspecified, not intractable, without status migrainosus: Secondary | ICD-10-CM

## 2012-06-17 MED ORDER — KETOROLAC TROMETHAMINE 60 MG/2ML IM SOLN
60.0000 mg | Freq: Once | INTRAMUSCULAR | Status: AC
  Administered 2012-06-17: 60 mg via INTRAMUSCULAR

## 2012-06-17 MED ORDER — SUMATRIPTAN SUCCINATE 100 MG PO TABS: 100.0000 mg | ORAL_TABLET | ORAL | Status: DC | PRN

## 2012-06-17 MED ORDER — PREDNISONE 10 MG PO TABS: ORAL_TABLET | ORAL | Status: DC

## 2012-06-17 MED ORDER — TRAMADOL HCL 50 MG PO TABS: 50.0000 mg | ORAL_TABLET | Freq: Two times a day (BID) | ORAL | Status: DC | PRN

## 2012-06-17 MED ORDER — ONDANSETRON HCL 4 MG/2ML IJ SOLN
4.0000 mg | Freq: Once | INTRAMUSCULAR | Status: AC
  Administered 2012-06-17: 4 mg via INTRAVENOUS

## 2012-06-17 MED ORDER — ONDANSETRON 4 MG PO TBDP: 4.0000 mg | ORAL_TABLET | Freq: Three times a day (TID) | ORAL | Status: DC | PRN

## 2012-06-17 MED ORDER — MEPERIDINE HCL 50 MG/ML IJ SOLN
50.0000 mg | Freq: Once | INTRAMUSCULAR | Status: AC
  Administered 2012-06-17: 50 mg via INTRAMUSCULAR

## 2012-06-17 NOTE — Progress Notes (Signed)
  Subjective:    Patient ID: [name] [name], male    DOB: 1980/10/20, 32 y.o.   MRN: [id number]  Migraine  This is a recurrent problem. The current episode started in the past 7 days. The problem occurs constantly. The problem has been gradually worsening. The pain is located in the left unilateral region. The pain quality is similar to prior headaches. The quality of the pain is described as sharp. The pain is at a severity of 9/10. The pain is severe. Associated symptoms include eye pain, nausea and vomiting. Pertinent negatives include no back pain, coughing, dizziness, neck pain, numbness, sore throat or weakness. The symptoms are aggravated by activity and bright light. He has tried Excedrin for the symptoms. The treatment provided no relief.   BP Readings from Last 3 Encounters:  06/17/12 128/88  06/13/12 128/62  11/02/11 106/68      Review of Systems  Constitutional: Positive for fatigue. Negative for appetite change and unexpected weight change.  HENT: Negative for nosebleeds, congestion, sore throat, sneezing, trouble swallowing and neck pain.   Eyes: Positive for pain. Negative for itching and visual disturbance.  Respiratory: Negative for cough.   Cardiovascular: Negative for chest pain, palpitations and leg swelling.  Gastrointestinal: Positive for nausea and vomiting. Negative for diarrhea, blood in stool and abdominal distention.  Genitourinary: Negative for frequency and hematuria.  Musculoskeletal: Negative for back pain, joint swelling and gait problem.  Skin: Negative for rash.  Neurological: Positive for headaches. Negative for dizziness, tremors, facial asymmetry, speech difficulty, weakness and numbness.  Psychiatric/Behavioral: Negative for suicidal ideas, sleep disturbance, dysphoric mood and agitation. The patient is not nervous/anxious.        Objective:   Physical Exam        Assessment & Plan:

## 2012-06-18 ENCOUNTER — Encounter: Payer: Self-pay | Admitting: Internal Medicine

## 2012-06-18 NOTE — Assessment & Plan Note (Addendum)
5/14 severe Demerol/Zofran/Toradol IM Predn x 3 d po Imitrex prn Head CT

## 2012-06-18 NOTE — Assessment & Plan Note (Signed)
zofran ODT prn

## 2012-11-02 ENCOUNTER — Ambulatory Visit (INDEPENDENT_AMBULATORY_CARE_PROVIDER_SITE_OTHER): Payer: 59 | Admitting: Internal Medicine

## 2012-11-02 ENCOUNTER — Encounter: Payer: Self-pay | Admitting: Internal Medicine

## 2012-11-02 VITALS — BP 110/74 | HR 53 | Temp 98.3°F | Wt 229.4 lb

## 2012-11-02 DIAGNOSIS — E663 Overweight: Secondary | ICD-10-CM

## 2012-11-02 DIAGNOSIS — F17200 Nicotine dependence, unspecified, uncomplicated: Secondary | ICD-10-CM

## 2012-11-02 DIAGNOSIS — J069 Acute upper respiratory infection, unspecified: Secondary | ICD-10-CM | POA: Insufficient documentation

## 2012-11-02 NOTE — Assessment & Plan Note (Addendum)
Weight today is 229.4, making BMI 34.8.   Plan  Encouraged weight management: Diet management: smart food choices, PORTION SIZE CONTROL, regular exercise. Goal - to loose 1-2 lbs.month. Target weight - 180 lbs

## 2012-11-02 NOTE — Patient Instructions (Signed)
Good to see you.  STOP SMOKING - THE BIGGEST THREAT TO YOUR HEALTH AND LONGEVITY  Viral Upper respiratory infection -  Plan Supportive care: Tylenol for fever, sore throat        NSAIDs  For headache or inflammation        For sinus pressure the sudafed 30 mg 2 or 3 times        Hydrate        Nasal saline spray; vaporizer   STOP SMOKING!!!!!!   Smoking Cessation, Tips for Success YOU CAN QUIT SMOKING If you are ready to quit smoking, congratulations! You have chosen to help yourself be healthier. Cigarettes bring nicotine, tar, carbon monoxide, and other irritants into your body. Your lungs, heart, and blood vessels will be able to work better without these poisons. There are many different ways to quit smoking. Nicotine gum, nicotine patches, a nicotine inhaler, or nicotine nasal spray can help with physical craving. Hypnosis, support groups, and medicines help break the habit of smoking. Here are some tips to help you quit for good.  Throw away all cigarettes.  Clean and remove all ashtrays from your home, work, and car.  On a card, write down your reasons for quitting. Carry the card with you and read it when you get the urge to smoke.  Cleanse your body of nicotine. Drink enough water and fluids to keep your urine clear or pale yellow. Do this after quitting to flush the nicotine from your body.  Learn to predict your moods. Do not let a bad situation be your excuse to have a cigarette. Some situations in your life might tempt you into wanting a cigarette.  Never have "just one" cigarette. It leads to wanting another and another. Remind yourself of your decision to quit.  Change habits associated with smoking. If you smoked while driving or when feeling stressed, try other activities to replace smoking. Stand up when drinking your coffee. Brush your teeth after eating. Sit in a different chair when you read the paper. Avoid alcohol while trying to quit, and try to drink fewer  caffeinated beverages. Alcohol and caffeine may urge you to smoke.  Avoid foods and drinks that can trigger a desire to smoke, such as sugary or spicy foods and alcohol.  Ask people who smoke not to smoke around you.  Have something planned to do right after eating or having a cup of coffee. Take a walk or exercise to perk you up. This will help to keep you from overeating.  Try a relaxation exercise to calm you down and decrease your stress. Remember, you may be tense and nervous for the first 2 weeks after you quit, but this will pass.  Find new activities to keep your hands busy. Play with a pen, coin, or rubber band. Doodle or draw things on paper.  Brush your teeth right after eating. This will help cut down on the craving for the taste of tobacco after meals. You can try mouthwash, too.  Use oral substitutes, such as lemon drops, carrots, a cinnamon stick, or chewing gum, in place of cigarettes. Keep them handy so they are available when you have the urge to smoke.  When you have the urge to smoke, try deep breathing.  Designate your home as a nonsmoking area.  If you are a heavy smoker, ask your caregiver about a prescription for nicotine chewing gum. It can ease your withdrawal from nicotine.  Reward yourself. Set aside the cigarette money you  save and buy yourself something nice.  Look for support from others. Join a support group or smoking cessation program. Ask someone at home or at work to help you with your plan to quit smoking.  Always ask yourself, "Do I need this cigarette or is this just a reflex?" Tell yourself, "Today, I choose not to smoke," or "I do not want to smoke." You are reminding yourself of your decision to quit, even if you do smoke a cigarette. HOW WILL I FEEL WHEN I QUIT SMOKING?  The benefits of not smoking start within days of quitting.  You may have symptoms of withdrawal because your body is used to nicotine (the addictive substance in cigarettes).  You may crave cigarettes, be irritable, feel very hungry, cough often, get headaches, or have difficulty concentrating.  The withdrawal symptoms are only temporary. They are strongest when you first quit but will go away within 10 to 14 days.  When withdrawal symptoms occur, stay in control. Think about your reasons for quitting. Remind yourself that these are signs that your body is healing and getting used to being without cigarettes.  Remember that withdrawal symptoms are easier to treat than the major diseases that smoking can cause.  Even after the withdrawal is over, expect periodic urges to smoke. However, these cravings are generally short-lived and will go away whether you smoke or not. Do not smoke!  If you relapse and smoke again, do not lose hope. Most smokers quit 3 times before they are successful.  If you relapse, do not give up! Plan ahead and think about what you will do the next time you get the urge to smoke. LIFE AS A NONSMOKER: MAKE IT FOR A MONTH, MAKE IT FOR LIFE Day 1: Hang this page where you will see it every day. Day 2: Get rid of all ashtrays, matches, and lighters. Day 3: Drink water. Breathe deeply between sips. Day 4: Avoid places with smoke-filled air, such as bars, clubs, or the smoking section of restaurants. Day 5: Keep track of how much money you save by not smoking. Day 6: Avoid boredom. Keep a good book with you or go to the movies. Day 7: Reward yourself! One week without smoking! Day 8: Make a dental appointment to get your teeth cleaned. Day 9: Decide how you will turn down a cigarette before it is offered to you. Day 10: Review your reasons for quitting. Day 11: Distract yourself. Stay active to keep your mind off smoking and to relieve tension. Take a walk, exercise, read a book, do a crossword puzzle, or try a new hobby. Day 12: Exercise. Get off the bus before your stop or use stairs instead of escalators. Day 13: Call on friends for support and  encouragement. Day 14: Reward yourself! Two weeks without smoking! Day 15: Practice deep breathing exercises. Day 16: Bet a friend that you can stay a nonsmoker. Day 17: Ask to sit in nonsmoking sections of restaurants. Day 18: Hang up "No Smoking" signs. Day 19: Think of yourself as a nonsmoker. Day 20: Each morning, tell yourself you will not smoke. Day 21: Reward yourself! Three weeks without smoking! Day 22: Think of smoking in negative ways. Remember how it stains your teeth, gives you bad breath, and leaves you short of breath. Day 23: Eat a nutritious breakfast. Day 24:Do not relive your days as a smoker. Day 25: Hold a pencil in your hand when talking on the telephone. Day 26: Tell all your friends  you do not smoke. Day 27: Think about how much better food tastes. Day 28: Remember, one cigarette is one too many. Day 29: Take up a hobby that will keep your hands busy. Day 30: Congratulations! One month without smoking! Give yourself a big reward. Your caregiver can direct you to community resources or hospitals for support, which may include:  Group support.  Education.  Hypnosis.  Subliminal therapy. Document Released: 10/11/2003 Document Revised: 04/06/2011 Document Reviewed: 10/29/2008 [facility] Patient Information 2014 [place], [place].    Upper Respiratory Infection, Adult An upper respiratory infection (URI) is also sometimes known as the common cold. The upper respiratory tract includes the nose, sinuses, throat, trachea, and bronchi. Bronchi are the airways leading to the lungs. Most people improve within 1 week, but symptoms can last up to 2 weeks. A residual cough may last even longer.  CAUSES Many different viruses can infect the tissues lining the upper respiratory tract. The tissues become irritated and inflamed and often become very moist. Mucus production is also common. A cold is contagious. You can easily spread the virus to others by oral contact. This  includes kissing, sharing a glass, coughing, or sneezing. Touching your mouth or nose and then touching a surface, which is then touched by another person, can also spread the virus. SYMPTOMS  Symptoms typically develop 1 to 3 days after you come in contact with a cold virus. Symptoms vary from person to person. They may include:  Runny nose.  Sneezing.  Nasal congestion.  Sinus irritation.  Sore throat.  Loss of voice (laryngitis).  Cough.  Fatigue.  Muscle aches.  Loss of appetite.  Headache.  Low-grade fever. DIAGNOSIS  You might diagnose your own cold based on familiar symptoms, since most people get a cold 2 to 3 times a year. Your caregiver can confirm this based on your exam. Most importantly, your caregiver can check that your symptoms are not due to another disease such as strep throat, sinusitis, pneumonia, asthma, or epiglottitis. Blood tests, throat tests, and X-rays are not necessary to diagnose a common cold, but they may sometimes be helpful in excluding other more serious diseases. Your caregiver will decide if any further tests are required. RISKS AND COMPLICATIONS  You may be at risk for a more severe case of the common cold if you smoke cigarettes, have chronic heart disease (such as heart failure) or lung disease (such as asthma), or if you have a weakened immune system. The very young and very old are also at risk for more serious infections. Bacterial sinusitis, middle ear infections, and bacterial pneumonia can complicate the common cold. The common cold can worsen asthma and chronic obstructive pulmonary disease (COPD). Sometimes, these complications can require emergency medical care and may be life-threatening. PREVENTION  The best way to protect against getting a cold is to practice good hygiene. Avoid oral or hand contact with people with cold symptoms. Wash your hands often if contact occurs. There is no clear evidence that vitamin C, vitamin E, echinacea,  or exercise reduces the chance of developing a cold. However, it is always recommended to get plenty of rest and practice good nutrition. TREATMENT  Treatment is directed at relieving symptoms. There is no cure. Antibiotics are not effective, because the infection is caused by a virus, not by bacteria. Treatment may include:  Increased fluid intake. Sports drinks offer valuable electrolytes, sugars, and fluids.  Breathing heated mist or steam (vaporizer or shower).  Eating chicken soup or other clear  broths, and maintaining good nutrition.  Getting plenty of rest.  Using gargles or lozenges for comfort.  Controlling fevers with ibuprofen or acetaminophen as directed by your caregiver.  Increasing usage of your inhaler if you have asthma. Zinc gel and zinc lozenges, taken in the first 24 hours of the common cold, can shorten the duration and lessen the severity of symptoms. Pain medicines may help with fever, muscle aches, and throat pain. A variety of non-prescription medicines are available to treat congestion and runny nose. Your caregiver can make recommendations and may suggest nasal or lung inhalers for other symptoms.  HOME CARE INSTRUCTIONS   Only take over-the-counter or prescription medicines for pain, discomfort, or fever as directed by your caregiver.  Use a warm mist humidifier or inhale steam from a shower to increase air moisture. This may keep secretions moist and make it easier to breathe.  Drink enough water and fluids to keep your urine clear or pale yellow.  Rest as needed.  Return to work when your temperature has returned to normal or as your caregiver advises. You may need to stay home longer to avoid infecting others. You can also use a face mask and careful hand washing to prevent spread of the virus. SEEK MEDICAL CARE IF:   After the first few days, you feel you are getting worse rather than better.  You need your caregiver's advice about medicines to control  symptoms.  You develop chills, worsening shortness of breath, or brown or red sputum. These may be signs of pneumonia.  You develop yellow or brown nasal discharge or pain in the face, especially when you bend forward. These may be signs of sinusitis.  You develop a fever, swollen neck glands, pain with swallowing, or white areas in the back of your throat. These may be signs of strep throat. SEEK IMMEDIATE MEDICAL CARE IF:   You have a fever.  You develop severe or persistent headache, ear pain, sinus pain, or chest pain.  You develop wheezing, a prolonged cough, cough up blood, or have a change in your usual mucus (if you have chronic lung disease).  You develop sore muscles or a stiff neck. Document Released: 07/08/2000 Document Revised: 04/06/2011 Document Reviewed: 05/16/2010 [facility] Patient Information 2014 [place], [place].

## 2012-11-02 NOTE — Assessment & Plan Note (Signed)
Supportive care with tylenol and/or ibuprofen for fever and HA. Nasal spray or decongestants as needed for sinus pressure/congestion. Will return if symptoms worsen or do not improve.

## 2012-11-02 NOTE — Progress Notes (Signed)
Subjective:     Patient ID: [name] [name], male   DOB: Oct 03, 1980, 32 y.o.   MRN: [id number]  HPI [name] [name] is a 32 year old man with a history of a recent negative head CT for intracranial pathology after presenting with migraines, nausea and vomiting who presents today with a 3 day HA on and off. Took an Advil which helped some and an imitrex which did not help. Also had a fever of 101.5. This HA is different from his previous migraines because it is sudden onset and not related to stress. Feels like a sinus pressure, feels like a dull 6-7/10 ache or like head with explode. Some chills, and night sweats last night that soak through clothes. Doesn't improve on lying down in dark room. No photophobia. No swelling in neck, axilla or groin. Some congestion, no sore throat, no coughing or wheezing. No discharge from eyes, no muscle or joint aches. No N/V/D. No weight change.  No migraines since last one in May. Hasn't taken imitrex until this recent HA. Drink 2-3 cups of coffee a day and 1-2 soft drinks. No insomnia. Smokes about 1/2 pack daily. No chronic pain medicine use.   Review of Systems General: As per HPI.  CV: No chest pain or tightness. No SOB.  Pulm: As per HPI.  GI: As per HPI.  Neuro: No changes in vision, no dizziness.  MSK: No myalgias/arthralgias.  Endocrine: No polyuria or polydipsia.     Objective:   Physical Exam Filed Vitals:   11/02/12 1048  BP: 110/74  Pulse: 53  Temp: 98.3 F (36.8 C)   General: Cooperative man sitting in the exam room in NAD. CV: RRR Pulm: CTAB, no wheezes or crackles HEENT: Sclera clear. No sinus tenderness. Some erythema and swelling of oropharnyx. TMs gray with light-reflex bilaterally. Fundi clear.  Neuro: CN II-XII intact. Upper extremities 5/5 strength symmetrically.  Current Outpatient Prescriptions on File Prior to Visit  Medication Sig Dispense Refill  . azithromycin (ZITHROMAX) 250 MG tablet Take 2 tablets today, then 1 tablet  daily for 4 days  6 tablet  0  . ondansetron (ZOFRAN ODT) 4 MG disintegrating tablet Take 1 tablet (4 mg total) by mouth every 8 (eight) hours as needed for nausea.  20 tablet  0  . predniSONE (DELTASONE) 10 MG tablet 20 mg qd x 2 d then 1 on day #3 pc  20 tablet  1  . SUMAtriptan (IMITREX) 100 MG tablet Take 1 tablet (100 mg total) by mouth every 2 (two) hours as needed for migraine.  10 tablet  0  . traMADol (ULTRAM) 50 MG tablet Take 1-2 tablets (50-100 mg total) by mouth 2 (two) times daily as needed for pain.  60 tablet  3   No current facility-administered medications on file prior to visit.      Assessment:    [name] [name] is a 32 year old man with a history of a migraine HA in May and negative CT who presents with a worsening intermittent HA over the past 3 days accompanied by a low grade fever this morning and some oropharyngeal inflammation. Patient states that this HA feels different than the migraines he has had in the past, and his lack of nausea, photophobia and his failure to improve with imitrex argues against migraine HA. The sensation of sinus pressure and fever suggest that this may be a URI. The short duration of the symptoms and the lack of sinus tenderness to palpation suggest this  isn't likely a bacterial sinusitis. May be an acute viral sinusitis or common cold.     Plan:     See plan by problem list.

## 2012-11-02 NOTE — Assessment & Plan Note (Signed)
Patient offered support to quit smoking. Patient made aware of options for medications to aid with smoking cessation-- buproprion and varencicline.

## 2012-11-03 ENCOUNTER — Ambulatory Visit: Payer: 59 | Admitting: Internal Medicine

## 2012-12-01 ENCOUNTER — Other Ambulatory Visit: Payer: Self-pay

## 2012-12-20 ENCOUNTER — Ambulatory Visit (INDEPENDENT_AMBULATORY_CARE_PROVIDER_SITE_OTHER): Payer: 59

## 2012-12-20 DIAGNOSIS — Z23 Encounter for immunization: Secondary | ICD-10-CM

## 2013-01-14 ENCOUNTER — Emergency Department (HOSPITAL_BASED_OUTPATIENT_CLINIC_OR_DEPARTMENT_OTHER)
Admission: EM | Admit: 2013-01-14 | Discharge: 2013-01-14 | Disposition: A | Discharge status: Home / Self Care | Payer: 59 | Attending: Emergency Medicine | Admitting: Emergency Medicine

## 2013-01-14 ENCOUNTER — Encounter (HOSPITAL_BASED_OUTPATIENT_CLINIC_OR_DEPARTMENT_OTHER): Payer: Self-pay | Admitting: Emergency Medicine

## 2013-01-14 DIAGNOSIS — Z8709 Personal history of other diseases of the respiratory system: Secondary | ICD-10-CM | POA: Insufficient documentation

## 2013-01-14 DIAGNOSIS — S61409A Unspecified open wound of unspecified hand, initial encounter: Secondary | ICD-10-CM | POA: Insufficient documentation

## 2013-01-14 DIAGNOSIS — Z87448 Personal history of other diseases of urinary system: Secondary | ICD-10-CM | POA: Insufficient documentation

## 2013-01-14 DIAGNOSIS — Z87891 Personal history of nicotine dependence: Secondary | ICD-10-CM | POA: Insufficient documentation

## 2013-01-14 DIAGNOSIS — W260XXA Contact with knife, initial encounter: Secondary | ICD-10-CM | POA: Insufficient documentation

## 2013-01-14 DIAGNOSIS — Y929 Unspecified place or not applicable: Secondary | ICD-10-CM | POA: Insufficient documentation

## 2013-01-14 DIAGNOSIS — S61412A Laceration without foreign body of left hand, initial encounter: Secondary | ICD-10-CM

## 2013-01-14 DIAGNOSIS — Y9389 Activity, other specified: Secondary | ICD-10-CM | POA: Insufficient documentation

## 2013-01-14 HISTORY — PX: OTHER SURGICAL HISTORY: SHX169

## 2013-01-14 NOTE — ED Provider Notes (Signed)
CSN: [id number]     Arrival date & time 01/14/13  1047 History   First MD Initiated Contact with Patient 01/14/13 1206     Chief Complaint  Patient presents with  . Extremity Laceration   (Consider location/radiation/quality/duration/timing/severity/associated sxs/prior Treatment) HPI Comments: Patient is a 32 year old male who presents with a left hand laceration that occurred prior to arrival. Patient reports accidentally cutting himself with his pocket knife. Patient denies pain to the area. He reports associated laceration and bleeding. No other injuries. Patient is up to date on tetanus shot.    Past Medical History  Diagnosis Date  . Unspecified chronic bronchitis     long resolved. ? related to smoking  . Prostatitis, acute Spring '13    resolved  . Current smoker Oct '13    motivated to quit   Past Surgical History  Procedure Laterality Date  . Mole excision     Family History  Problem Relation Age of Onset  . Crohn's disease Mother    History  Substance Use Topics  . Smoking status: Former Games developer  . Smokeless tobacco: Former Neurosurgeon     Comment: smoking cessation: diary, behavior change plan, quit  . Alcohol Use: 7.0 oz/week    14 drink(s) per week     Comment: 1-2 beers per day    Review of Systems  Constitutional: Negative for fever, chills and fatigue.  HENT: Negative for trouble swallowing.   Eyes: Negative for visual disturbance.  Respiratory: Negative for shortness of breath.   Cardiovascular: Negative for chest pain and palpitations.  Gastrointestinal: Negative for nausea, vomiting, abdominal pain and diarrhea.  Genitourinary: Negative for dysuria and difficulty urinating.  Musculoskeletal: Negative for arthralgias and neck pain.  Skin: Positive for wound. Negative for color change.  Neurological: Negative for dizziness and weakness.  Psychiatric/Behavioral: Negative for dysphoric mood.    Allergies  Review of patient's allergies indicates no known  allergies.  Home Medications  No current outpatient prescriptions on file. BP 154/81  Pulse 63  Temp(Src) 98.4 F (36.9 C) (Oral)  Resp 18  Ht 5\' 7"  (1.702 m)  Wt 225 lb (102.059 kg)  BMI 35.23 kg/m2  SpO2 99% Physical Exam  Nursing note and vitals reviewed. Constitutional: He appears well-developed and well-nourished. No distress.  HENT:  Head: Normocephalic and atraumatic.  Eyes: Conjunctivae are normal.  Neck: Normal range of motion.  Cardiovascular: Normal rate, regular rhythm and intact distal pulses.  Exam reveals no gallop and no friction rub.   No murmur heard. Pulmonary/Chest: Effort normal and breath sounds normal. He has no wheezes. He has no rales. He exhibits no tenderness.  Musculoskeletal: Normal range of motion.  Neurological: He is alert. Coordination normal.  Sensation and strength intact distal to laceration. Speech is goal-oriented. Moves limbs without ataxia.   Skin: Skin is warm and dry.  3.5 laceration of thenar aspect of left hand.   Psychiatric: He has a normal mood and affect. His behavior is normal.    ED Course  Procedures (including critical care time)  LACERATION REPAIR Performed by: Emilia Beck Authorized by: Emilia Beck Consent: Verbal consent obtained. Risks and benefits: risks, benefits and alternatives were discussed Consent given by: patient Patient identity confirmed: provided demographic data Prepped and Draped in normal sterile fashion Wound explored  Laceration Location: left volar hand thenar aspect   Laceration Length: 3.5 cm  No Foreign Bodies seen or palpated  Anesthesia: local infiltration  Local anesthetic: lidocaine 2% without epinephrine  Anesthetic  total: 2 ml  Irrigation method: syringe Amount of cleaning: standard  Skin closure: 4-0 prolene  Number of sutures: 3  Technique: simple  Patient tolerance: Patient tolerated the procedure well with no immediate complications.   Labs  Review Labs Reviewed - No data to display Imaging Review No results found.  EKG Interpretation   None       MDM   1. Laceration of left hand, initial encounter     12:38 PM Laceration repaired without difficulty. Patient is up to date on tetanus. Wound cleaned thoroughly and explored for foreign bodies. No neurovascular compromise. Patient instructed to have sutures removed at PCP office in 10 days.     Emilia Beck, PA-C 01/15/13 2348

## 2013-01-14 NOTE — ED Notes (Signed)
Patient here with left hand/thumb laceration after cutting same with knife this am. No bleeding on arrival

## 2013-01-16 NOTE — ED Provider Notes (Signed)
Medical screening examination/treatment/procedure(s) were performed by non-physician practitioner and as supervising physician I was immediately available for consultation/collaboration.  EKG Interpretation   None         Rolan Bucco, MD 01/16/13 1256

## 2013-11-24 ENCOUNTER — Ambulatory Visit (INDEPENDENT_AMBULATORY_CARE_PROVIDER_SITE_OTHER): Payer: 59

## 2013-11-24 DIAGNOSIS — Z23 Encounter for immunization: Secondary | ICD-10-CM

## 2014-10-08 ENCOUNTER — Other Ambulatory Visit (INDEPENDENT_AMBULATORY_CARE_PROVIDER_SITE_OTHER): Payer: BLUE CROSS/BLUE SHIELD

## 2014-10-08 ENCOUNTER — Ambulatory Visit (INDEPENDENT_AMBULATORY_CARE_PROVIDER_SITE_OTHER): Payer: BLUE CROSS/BLUE SHIELD | Admitting: Family

## 2014-10-08 ENCOUNTER — Encounter: Payer: Self-pay | Admitting: Family

## 2014-10-08 VITALS — BP 136/82 | HR 53 | Temp 98.6°F | Resp 16 | Ht 68.0 in | Wt 243.0 lb

## 2014-10-08 DIAGNOSIS — Z23 Encounter for immunization: Secondary | ICD-10-CM

## 2014-10-08 DIAGNOSIS — Z309 Encounter for contraceptive management, unspecified: Secondary | ICD-10-CM | POA: Diagnosis not present

## 2014-10-08 DIAGNOSIS — Z0000 Encounter for general adult medical examination without abnormal findings: Secondary | ICD-10-CM | POA: Diagnosis not present

## 2014-10-08 DIAGNOSIS — Z716 Tobacco abuse counseling: Secondary | ICD-10-CM

## 2014-10-08 DIAGNOSIS — R748 Abnormal levels of other serum enzymes: Secondary | ICD-10-CM

## 2014-10-08 DIAGNOSIS — Z3009 Encounter for other general counseling and advice on contraception: Secondary | ICD-10-CM

## 2014-10-08 LAB — COMPREHENSIVE METABOLIC PANEL
ALBUMIN: 4.5 g/dL (ref 3.5–5.2)
ALK PHOS: 62 U/L (ref 39–117)
ALT: 94 U/L — AB (ref 0–53)
AST: 45 U/L — AB (ref 0–37)
BUN: 13 mg/dL (ref 6–23)
CHLORIDE: 102 meq/L (ref 96–112)
CO2: 27 mEq/L (ref 19–32)
CREATININE: 0.93 mg/dL (ref 0.40–1.50)
Calcium: 9.5 mg/dL (ref 8.4–10.5)
GFR: 99.33 mL/min (ref >60.00)
Glucose, Bld: 98 mg/dL (ref 70–99)
Potassium: 4.3 mEq/L (ref 3.5–5.1)
SODIUM: 138 meq/L (ref 135–145)
TOTAL PROTEIN: 7.5 g/dL (ref 6.0–8.3)
Total Bilirubin: 1.5 mg/dL — ABNORMAL HIGH (ref 0.2–1.2)

## 2014-10-08 LAB — CBC
HCT: 51.4 % (ref 39.0–52.0)
HEMOGLOBIN: 17.7 g/dL — AB (ref 13.0–17.0)
MCHC: 34.5 g/dL (ref 30.0–36.0)
MCV: 91.6 fl (ref 78.0–100.0)
Platelets: 186 10*3/uL (ref 150.0–400.0)
RBC: 5.61 Mil/uL (ref 4.22–5.81)
RDW: 12.4 % (ref 11.5–15.5)
WBC: 5.9 10*3/uL (ref 4.0–10.5)

## 2014-10-08 LAB — LIPID PANEL
CHOLESTEROL: 214 mg/dL — AB (ref 0–200)
HDL: 42.8 mg/dL (ref >39.00)
LDL CALC: 135 mg/dL — AB (ref 0–99)
NonHDL: 171.41
Total CHOL/HDL Ratio: 5
Triglycerides: 182 mg/dL — ABNORMAL HIGH (ref 0.0–149.0)
VLDL: 36.4 mg/dL (ref 0.0–40.0)

## 2014-10-08 LAB — TSH: TSH: 2 u[IU]/mL (ref 0.35–4.50)

## 2014-10-08 MED ORDER — BUPROPION HCL ER (SMOKING DET) 150 MG PO TB12: ORAL_TABLET | ORAL | Status: DC

## 2014-10-08 NOTE — Progress Notes (Signed)
Subjective:    Patient ID: [name] [name], male    DOB: 1980-12-25, 34 y.o.   MRN: [id number]  Chief Complaint  Patient presents with  . Annual Exam    HPI:  [name] [name] is a 34 y.o. male who presents today for an annual wellness visit.   1) Health Maintenance -   Diet - Averages about 2 meals consisting of sandwiches, cassoroles, fruits, vegetables, some dairy; 3-4 cups of Caffeine daily  Exercise - 2x per week at the gym with resistance training   2) Preventative Exams / Immunizations:  Dental -- Up to date  Vision -- Due for exam   Health Maintenance  Topic Date Due  . HIV Screening  07/24/1996  . INFLUENZA VACCINE  08/27/2014  . TETANUS/TDAP  11/01/2021    Immunization History  Administered Date(s) Administered  . Influenza Split 11/02/2011  . Influenza Whole 11/17/2007, 10/25/2008, 11/13/2009  . Influenza,inj,Quad PF,36+ Mos 12/20/2012, 11/24/2013, 10/08/2014  . Tdap 11/02/2011   3.) Smoking cessation - Interested in quitting smoking. Has previously attempted to quit smoking in the past with Chantix. Indicates that anxiety may have played a role in his inability to quit previously. Currently has a 13 year pack history and is interested in trying Zyban.    No Known Allergies   No outpatient prescriptions prior to visit.   No facility-administered medications prior to visit.     Past Medical History  Diagnosis Date  . Unspecified chronic bronchitis     long resolved. ? related to smoking  . Prostatitis, acute Spring '13    resolved  . Current smoker Oct '13    motivated to quit  . Asthma      Past Surgical History  Procedure Laterality Date  . Mole excision    . Wisdom tooth extraction       Family History  Problem Relation Age of Onset  . Crohn's disease Mother   . Healthy Father   . Hypertension Paternal Grandfather   . CAD Paternal Grandfather      Social History   Social History  . Marital Status: Married    Spouse  Name: N/A  . Number of Children: 2  . Years of Education: 15   Occupational History  . property developmen/construction    Social History Main Topics  . Smoking status: Current Every Day Smoker -- 1.00 packs/day for 12 years    Types: Cigarettes  . Smokeless tobacco: Never Used     Comment: smoking cessation: diary, behavior change plan, quit  . Alcohol Use: 17.4 oz/week    14 Standard drinks or equivalent, 15 Cans of beer per week  . Drug Use: No  . Sexual Activity:    Partners: Female   Other Topics Concern  . 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 QUIT SMOKING MARCH 2010 but RESUMED   Fun: Build stuff and make stuff   Denies religious beleifs effecting health care.      Review of Systems  Constitutional: Denies fever, chills, fatigue, or significant weight gain/loss. HENT: Head: Denies headache or neck pain Ears: Denies changes in hearing, ringing in ears, earache, drainage Nose: Denies discharge, stuffiness, itching, nosebleed, sinus pain Throat: Denies sore throat, hoarseness, dry mouth, sores, thrush Eyes: Denies loss/changes in vision, pain, redness, blurry/double vision, flashing lights Cardiovascular: Denies chest pain/discomfort, tightness,  palpitations, shortness of breath with activity, difficulty lying down, swelling, sudden awakening with shortness of breath Respiratory: Denies shortness of breath, cough, sputum production, wheezing Gastrointestinal: Denies dysphasia, heartburn, change in appetite, nausea, change in bowel habits, rectal bleeding, constipation, diarrhea, yellow skin or eyes Genitourinary: Denies frequency, urgency, burning/pain, blood in urine, incontinence, change in urinary strength. Musculoskeletal: Denies muscle/joint pain, stiffness, back pain, redness or swelling of joints, trauma Skin: Denies  rashes, lumps, itching, dryness, color changes, or hair/nail changes Neurological: Denies dizziness, fainting, seizures, weakness, numbness, tingling, tremor Psychiatric - Denies nervousness, stress, depression or memory loss Endocrine: Denies heat or cold intolerance, sweating, frequent urination, excessive thirst, changes in appetite Hematologic: Denies ease of bruising or bleeding     Objective:    BP 136/82 mmHg  Pulse 53  Temp(Src) 98.6 F (37 C) (Oral)  Resp 16  Ht 5\' 8"  (1.727 m)  Wt 243 lb (110.224 kg)  BMI 36.96 kg/m2  SpO2 98% Nursing note and vital signs reviewed.  Physical Exam  Constitutional: He is oriented to person, place, and time. He appears well-developed and well-nourished.  HENT:  Head: Normocephalic.  Right Ear: Hearing, tympanic membrane, external ear and ear canal normal.  Left Ear: Hearing, tympanic membrane, external ear and ear canal normal.  Nose: Nose normal.  Mouth/Throat: Uvula is midline, oropharynx is clear and moist and mucous membranes are normal.  Eyes: Conjunctivae and EOM are normal. Pupils are equal, round, and reactive to light.  Neck: Neck supple. No JVD present. No tracheal deviation present. No thyromegaly present.  Cardiovascular: Normal rate, regular rhythm, normal heart sounds and intact distal pulses.   Pulmonary/Chest: Effort normal and breath sounds normal.  Abdominal: Soft. Bowel sounds are normal. He exhibits no distension and no mass. There is no tenderness. There is no rebound and no guarding.  Musculoskeletal: Normal range of motion. He exhibits no edema or tenderness.  Lymphadenopathy:    He has no cervical adenopathy.  Neurological: He is alert and oriented to person, place, and time. He has normal reflexes. No cranial nerve deficit. He exhibits normal muscle tone. Coordination normal.  Skin: Skin is warm and dry.  Psychiatric: He has a normal mood and affect. His behavior is normal. Judgment and thought content normal.        Assessment & Plan:   Problem List Items Addressed This Visit      Other   Routine health maintenance - Primary    1) Anticipatory Guidance: Discussed importance of wearing a seatbelt while driving and not texting while driving; changing batteries in smoke detector at least once annually; wearing suntan lotion when outside; eating a balanced and moderate diet; getting physical activity at least 30 minutes per day.  2) Immunizations / Screenings / Labs:  Influenza up to date per recommendations. All other immunizations are up to date per recommendations. Due for a dental screen. All other screenings are up to date per recommendations. Obtain CBC, CMET, Lipid profile and TSH.  Overall well exam. Patient has risk factors for cardiovascular disease including obesity and tobacco use. Discussed importance of nutrient dense diet with decreased saturated fats emphasizing fruits and vegetables. Continue exercising with increasing physical activity to 30 minutes most days of the week and incorporating cardiovascular exercise to establish a goal of losing 5-10% of current body weight. Smoking cessation discussed and patient is ready to quit smoking again. Interested in starting Zyban. Start Zyban. Instructions provided and resources reviewed to assist in tobacco cessation. Would like to be  referred for vasectomy. Referral to urology placed. Follow-up office visit in 1 month for smoking cessation follow-up and follow-up prevention exam in one year.        Relevant Medications   buPROPion (ZYBAN) 150 MG 12 hr tablet   Other Relevant Orders   Ambulatory referral to Urology   CBC   Comprehensive metabolic panel   Lipid panel   TSH   Encounter for tobacco use cessation counseling    Approximate the 13-pack-year history. Ready to quit smoking. Start Wellbutrin. Reviewed risks and benefits including methods of tobacco cessation. Follow-up in one month to determine effectiveness.       Other Visit  Diagnoses    Vasectomy evaluation        Relevant Orders    Ambulatory referral to Urology

## 2014-10-08 NOTE — Progress Notes (Signed)
Pre visit review using our clinic review tool, if applicable. No additional management support is needed unless otherwise documented below in the visit note. 

## 2014-10-08 NOTE — Assessment & Plan Note (Signed)
Approximate the 13-pack-year history. Ready to quit smoking. Start Wellbutrin. Reviewed risks and benefits including methods of tobacco cessation. Follow-up in one month to determine effectiveness.

## 2014-10-08 NOTE — Patient Instructions (Addendum)
Thank you for choosing [facility].  Summary/Instructions:  Your prescription(s) have been submitted to your pharmacy or been printed and provided for you. Please take as directed and contact our office if you believe you are having problem(s) with the medication(s) or have any questions.  Please stop by the lab on the basement level of the building for your blood work. Your results will be released to MyChart (or called to you) after review, usually within 72 hours after test completion. If any changes need to be made, you will be notified at that same time.  Health Maintenance A healthy lifestyle and preventative care can promote health and wellness.  Maintain regular health, dental, and eye exams.  Eat a healthy diet. Foods like vegetables, fruits, whole grains, low-fat dairy products, and lean protein foods contain the nutrients you need and are low in calories. Decrease your intake of foods high in solid fats, added sugars, and salt. Get information about a proper diet from your health care provider, if necessary.  Regular physical exercise is one of the most important things you can do for your health. Most adults should get at least 150 minutes of moderate-intensity exercise (any activity that increases your heart rate and causes you to sweat) each week. In addition, most adults need muscle-strengthening exercises on 2 or more days a week.   Maintain a healthy weight. The body mass index (BMI) is a screening tool to identify possible weight problems. It provides an estimate of body fat based on height and weight. Your health care provider can find your BMI and can help you achieve or maintain a healthy weight. For males 20 years and older:  A BMI below 18.5 is considered underweight.  A BMI of 18.5 to 24.9 is normal.  A BMI of 25 to 29.9 is considered overweight.  A BMI of 30 and above is considered obese.  Maintain normal blood lipids and cholesterol by exercising and  minimizing your intake of saturated fat. Eat a balanced diet with plenty of fruits and vegetables. Blood tests for lipids and cholesterol should begin at age 20 and be repeated every 5 years. If your lipid or cholesterol levels are high, you are over age 50, or you are at high risk for heart disease, you may need your cholesterol levels checked more frequently.Ongoing high lipid and cholesterol levels should be treated with medicines if diet and exercise are not working.  If you smoke, find out from your health care provider how to quit. If you do not use tobacco, do not start.  Lung cancer screening is recommended for adults aged 55-80 years who are at high risk for developing lung cancer because of a history of smoking. A yearly low-dose CT scan of the lungs is recommended for people who have at least a 30-pack-year history of smoking and are current smokers or have quit within the past 15 years. A pack year of smoking is smoking an average of 1 pack of cigarettes a day for 1 year (for example, a 30-pack-year history of smoking could mean smoking 1 pack a day for 30 years or 2 packs a day for 15 years). Yearly screening should continue until the smoker has stopped smoking for at least 15 years. Yearly screening should be stopped for people who develop a health problem that would prevent them from having lung cancer treatment.  If you choose to drink alcohol, do not have more than 2 drinks per day. One drink is considered to be 12   oz (360 mL) of beer, 5 oz (150 mL) of wine, or 1.5 oz (45 mL) of liquor.  Avoid the use of street drugs. Do not share needles with anyone. Ask for help if you need support or instructions about stopping the use of drugs.  High blood pressure causes heart disease and increases the risk of stroke. Blood pressure should be checked at least every 1-2 years. Ongoing high blood pressure should be treated with medicines if weight loss and exercise are not effective.  If you are  45-79 years old, ask your health care provider if you should take aspirin to prevent heart disease.  Diabetes screening involves taking a blood sample to check your fasting blood sugar level. This should be done once every 3 years after age 45 if you are at a normal weight and without risk factors for diabetes. Testing should be considered at a younger age or be carried out more frequently if you are overweight and have at least 1 risk factor for diabetes.  Colorectal cancer can be detected and often prevented. Most routine colorectal cancer screening begins at the age of 50 and continues through age 75. However, your health care provider may recommend screening at an earlier age if you have risk factors for colon cancer. On a yearly basis, your health care provider may provide home test kits to check for hidden blood in the stool. A small camera at the end of a tube may be used to directly examine the colon (sigmoidoscopy or colonoscopy) to detect the earliest forms of colorectal cancer. Talk to your health care provider about this at age 50 when routine screening begins. A direct exam of the colon should be repeated every 5-10 years through age 75, unless early forms of precancerous polyps or small growths are found.  People who are at an increased risk for hepatitis B should be screened for this virus. You are considered at high risk for hepatitis B if:  You were born in a country where hepatitis B occurs often. Talk with your health care provider about which countries are considered high risk.  Your parents were born in a high-risk country and you have not received a shot to protect against hepatitis B (hepatitis B vaccine).  You have HIV or AIDS.  You use needles to inject street drugs.  You live with, or have sex with, someone who has hepatitis B.  You are a man who has sex with other men (MSM).  You get hemodialysis treatment.  You take certain medicines for conditions like cancer, organ  transplantation, and autoimmune conditions.  Hepatitis C blood testing is recommended for all people born from 1945 through 1965 and any individual with known risk factors for hepatitis C.  Healthy men should no longer receive prostate-specific antigen (PSA) blood tests as part of routine cancer screening. Talk to your health care provider about prostate cancer screening.  Testicular cancer screening is not recommended for adolescents or adult males who have no symptoms. Screening includes self-exam, a health care provider exam, and other screening tests. Consult with your health care provider about any symptoms you have or any concerns you have about testicular cancer.  Practice safe sex. Use condoms and avoid high-risk sexual practices to reduce the spread of sexually transmitted infections (STIs).  You should be screened for STIs, including gonorrhea and chlamydia if:  You are sexually active and are younger than 24 years.  You are older than 24 years, and your health care provider tells   you that you are at risk for this type of infection.  Your sexual activity has changed since you were last screened, and you are at an increased risk for chlamydia or gonorrhea. Ask your health care provider if you are at risk.  If you are at risk of being infected with HIV, it is recommended that you take a prescription medicine daily to prevent HIV infection. This is called pre-exposure prophylaxis (PrEP). You are considered at risk if:  You are a man who has sex with other men (MSM).  You are a heterosexual man who is sexually active with multiple partners.  You take drugs by injection.  You are sexually active with a partner who has HIV.  Talk with your health care provider about whether you are at high risk of being infected with HIV. If you choose to begin PrEP, you should first be tested for HIV. You should then be tested every 3 months for as long as you are taking PrEP.  Use sunscreen. Apply  sunscreen liberally and repeatedly throughout the day. You should seek shade when your shadow is shorter than you. Protect yourself by wearing long sleeves, pants, a wide-brimmed hat, and sunglasses year round whenever you are outdoors.  Tell your health care provider of new moles or changes in moles, especially if there is a change in shape or color. Also, tell your health care provider if a mole is larger than the size of a pencil eraser.  A one-time screening for abdominal aortic aneurysm (AAA) and surgical repair of large AAAs by ultrasound is recommended for men aged 65-75 years who are current or former smokers.  Stay current with your vaccines (immunizations). Document Released: 07/11/2007 Document Revised: 01/17/2013 Document Reviewed: 06/09/2010 Exit[employer] Patient Information 2015 [employer]. This information is not intended to replace advice given to you by your health care provider. Make sure you discuss any questions you have with your health care provider.   Smoking Cessation Quitting smoking is important to your health and has many advantages. However, it is not always easy to quit since nicotine is a very addictive drug. Oftentimes, people try 3 times or more before being able to quit. This document explains the best ways for you to prepare to quit smoking. Quitting takes hard work and a lot of effort, but you can do it. ADVANTAGES OF QUITTING SMOKING  You will live longer, feel better, and live better.  Your body will feel the impact of quitting smoking almost immediately.  Within 20 minutes, blood pressure decreases. Your pulse returns to its normal level.  After 8 hours, carbon monoxide levels in the blood return to normal. Your oxygen level increases.  After 24 hours, the chance of having a heart attack starts to decrease. Your breath, hair, and body stop smelling like smoke.  After 48 hours, damaged nerve endings begin to recover. Your sense of taste and smell  improve.  After 72 hours, the body is virtually free of nicotine. Your bronchial tubes relax and breathing becomes easier.  After 2 to 12 weeks, lungs can hold more air. Exercise becomes easier and circulation improves.  The risk of having a heart attack, stroke, cancer, or lung disease is greatly reduced.  After 1 year, the risk of coronary heart disease is cut in half.  After 5 years, the risk of stroke falls to the same as a nonsmoker.  After 10 years, the risk of lung cancer is cut in half and the risk of other cancers   decreases significantly.  After 15 years, the risk of coronary heart disease drops, usually to the level of a nonsmoker.  If you are pregnant, quitting smoking will improve your chances of having a healthy baby.  The people you live with, especially any children, will be healthier.  You will have extra money to spend on things other than cigarettes. QUESTIONS TO THINK ABOUT BEFORE ATTEMPTING TO QUIT You may want to talk about your answers with your health care provider.  Why do you want to quit?  If you tried to quit in the past, what helped and what did not?  What will be the most difficult situations for you after you quit? How will you plan to handle them?  Who can help you through the tough times? Your family? Friends? A health care provider?  What pleasures do you get from smoking? What ways can you still get pleasure if you quit? Here are some questions to ask your health care provider:  How can you help me to be successful at quitting?  What medicine do you think would be best for me and how should I take it?  What should I do if I need more help?  What is smoking withdrawal like? How can I get information on withdrawal? GET READY  Set a quit date.  Change your environment by getting rid of all cigarettes, ashtrays, matches, and lighters in your home, car, or work. Do not let people smoke in your home.  Review your past attempts to quit. Think  about what worked and what did not. GET SUPPORT AND ENCOURAGEMENT You have a better chance of being successful if you have help. You can get support in many ways.  Tell your family, friends, and coworkers that you are going to quit and need their support. Ask them not to smoke around you.  Get individual, group, or telephone counseling and support. Programs are available at local hospitals and health centers. Call your local health department for information about programs in your area.  Spiritual beliefs and practices may help some smokers quit.  Download a "quit meter" on your computer to keep track of quit statistics, such as how long you have gone without smoking, cigarettes not smoked, and money saved.  Get a self-help book about quitting smoking and staying off tobacco. LEARN NEW SKILLS AND BEHAVIORS  Distract yourself from urges to smoke. Talk to someone, go for a walk, or occupy your time with a task.  Change your normal routine. Take a different route to work. Drink tea instead of coffee. Eat breakfast in a different place.  Reduce your stress. Take a hot bath, exercise, or read a book.  Plan something enjoyable to do every day. Reward yourself for not smoking.  Explore interactive web-based programs that specialize in helping you quit. GET MEDICINE AND USE IT CORRECTLY Medicines can help you stop smoking and decrease the urge to smoke. Combining medicine with the above behavioral methods and support can greatly increase your chances of successfully quitting smoking.  Nicotine replacement therapy helps deliver nicotine to your body without the negative effects and risks of smoking. Nicotine replacement therapy includes nicotine gum, lozenges, inhalers, nasal sprays, and skin patches. Some may be available over-the-counter and others require a prescription.  Antidepressant medicine helps people abstain from smoking, but how this works is unknown. This medicine is available by  prescription.  Nicotinic receptor partial agonist medicine simulates the effect of nicotine in your brain. This medicine is available by   prescription. Ask your health care provider for advice about which medicines to use and how to use them based on your health history. Your health care provider will tell you what side effects to look out for if you choose to be on a medicine or therapy. Carefully read the information on the package. Do not use any other product containing nicotine while using a nicotine replacement product.  RELAPSE OR DIFFICULT SITUATIONS Most relapses occur within the first 3 months after quitting. Do not be discouraged if you start smoking again. Remember, most people try several times before finally quitting. You may have symptoms of withdrawal because your body is used to nicotine. You may crave cigarettes, be irritable, feel very hungry, cough often, get headaches, or have difficulty concentrating. The withdrawal symptoms are only temporary. They are strongest when you first quit, but they will go away within 10-14 days. To reduce the chances of relapse, try to:  Avoid drinking alcohol. Drinking lowers your chances of successfully quitting.  Reduce the amount of caffeine you consume. Once you quit smoking, the amount of caffeine in your body increases and can give you symptoms, such as a rapid heartbeat, sweating, and anxiety.  Avoid smokers because they can make you want to smoke.  Do not let weight gain distract you. Many smokers will gain weight when they quit, usually less than 10 pounds. Eat a healthy diet and stay active. You can always lose the weight gained after you quit.  Find ways to improve your mood other than smoking. FOR MORE INFORMATION  www.smokefree.gov  Document Released: 01/06/2001 Document Revised: 05/29/2013 Document Reviewed: 04/23/2011 Exit[employer] Patient Information 2015 [employer]. This information is not intended to replace advice given to you  by your health care provider. Make sure you discuss any questions you have with your health care provider.  

## 2014-10-08 NOTE — Assessment & Plan Note (Addendum)
1) Anticipatory Guidance: Discussed importance of wearing a seatbelt while driving and not texting while driving; changing batteries in smoke detector at least once annually; wearing suntan lotion when outside; eating a balanced and moderate diet; getting physical activity at least 30 minutes per day.  2) Immunizations / Screenings / Labs:  Influenza up to date per recommendations. All other immunizations are up to date per recommendations. Due for a dental screen. All other screenings are up to date per recommendations. Obtain CBC, CMET, Lipid profile and TSH.  Overall well exam. Patient has risk factors for cardiovascular disease including obesity and tobacco use. Discussed importance of nutrient dense diet with decreased saturated fats emphasizing fruits and vegetables. Continue exercising with increasing physical activity to 30 minutes most days of the week and incorporating cardiovascular exercise to establish a goal of losing 5-10% of current body weight. Smoking cessation discussed and patient is ready to quit smoking again. Interested in starting Zyban. Start Zyban. Instructions provided and resources reviewed to assist in tobacco cessation. Would like to be referred for vasectomy. Referral to urology placed. Follow-up office visit in 1 month for smoking cessation follow-up and follow-up prevention exam in one year.

## 2014-10-17 ENCOUNTER — Encounter: Payer: Self-pay | Admitting: Family

## 2014-11-07 ENCOUNTER — Ambulatory Visit (INDEPENDENT_AMBULATORY_CARE_PROVIDER_SITE_OTHER): Payer: BLUE CROSS/BLUE SHIELD | Admitting: Family

## 2014-11-07 ENCOUNTER — Encounter: Payer: Self-pay | Admitting: Family

## 2014-11-07 ENCOUNTER — Other Ambulatory Visit (INDEPENDENT_AMBULATORY_CARE_PROVIDER_SITE_OTHER): Payer: BLUE CROSS/BLUE SHIELD

## 2014-11-07 VITALS — BP 128/82 | HR 63 | Temp 98.3°F | Resp 18 | Ht 68.0 in | Wt 242.1 lb

## 2014-11-07 DIAGNOSIS — R748 Abnormal levels of other serum enzymes: Secondary | ICD-10-CM

## 2014-11-07 DIAGNOSIS — Z716 Tobacco abuse counseling: Secondary | ICD-10-CM

## 2014-11-07 DIAGNOSIS — F17200 Nicotine dependence, unspecified, uncomplicated: Secondary | ICD-10-CM | POA: Diagnosis not present

## 2014-11-07 DIAGNOSIS — E669 Obesity, unspecified: Secondary | ICD-10-CM | POA: Diagnosis not present

## 2014-11-07 LAB — HEPATIC FUNCTION PANEL
ALT: 88 U/L — AB (ref 0–53)
AST: 40 U/L — ABNORMAL HIGH (ref 0–37)
Albumin: 4.3 g/dL (ref 3.5–5.2)
Alkaline Phosphatase: 64 U/L (ref 39–117)
BILIRUBIN DIRECT: 0.2 mg/dL (ref 0.0–0.3)
BILIRUBIN TOTAL: 1.1 mg/dL (ref 0.2–1.2)
TOTAL PROTEIN: 7.2 g/dL (ref 6.0–8.3)

## 2014-11-07 LAB — AMYLASE: AMYLASE: 45 U/L (ref 27–131)

## 2014-11-07 LAB — LIPASE: LIPASE: 30 U/L (ref 11.0–59.0)

## 2014-11-07 NOTE — Progress Notes (Signed)
Pre visit review using our clinic review tool, if applicable. No additional management support is needed unless otherwise documented below in the visit note. 

## 2014-11-07 NOTE — Assessment & Plan Note (Signed)
Smoking cessation has been successful with no adverse effects of the Zyban and has not smoked in about 2-3 weeks since starting the medication. Continue current dose of Zyban. Discussed therapy goals and that he can stop taking the medication after 7 weeks if interested or may continue up to 3 months if needed. Re-affirmed resources to use for smoking cessation. Follow up as needed.

## 2014-11-07 NOTE — Patient Instructions (Signed)
Thank you for choosing [facility].  Summary/Instructions:  If your symptoms worsen or fail to improve, please contact our office for further instruction, or in case of emergency go directly to the emergency room at the closest medical facility.     

## 2014-11-07 NOTE — Assessment & Plan Note (Signed)
Currently working on improving nutrition and physical activity as part of lifestyle changes.

## 2014-11-07 NOTE — Progress Notes (Signed)
   Subjective:    Patient ID: [name] [name], male    DOB: 20-Mar-1980, 34 y.o.   MRN: [id number]  Chief Complaint  Patient presents with  . Follow-up    medication for smoking has worked only smoked 2 times in 3 weeks    HPI:  [name] [name] is a 34 y.o. male who  has a past medical history of Unspecified chronic bronchitis (HCC); Prostatitis, acute (Spring '13); Current smoker (Oct '13); and Asthma. and presents today for an office follow up.  Smoking cessation -  Recently started on Zyban to assist with smoking cessation. Takes the medication as prescribed and denies adverse side effects. Reports that he has had 2 cigarettes in about a 3 weeks time with the last smoking time about 2-2.5 weeks ago. Reports that his stress level has maintained about average. Increased amount of sleep.  No Known Allergies   Current Outpatient Prescriptions on File Prior to Visit  Medication Sig Dispense Refill  . buPROPion (ZYBAN) 150 MG 12 hr tablet Take 1 tablet daily for 3 days then increase to 1 tablet twice daily 180 tablet 0   No current facility-administered medications on file prior to visit.     Past Surgical History  Procedure Laterality Date  . Mole excision    . Wisdom tooth extraction       Review of Systems  Constitutional: Negative for fever and chills.  Respiratory: Negative for cough, chest tightness and shortness of breath.   Cardiovascular: Negative for chest pain, palpitations and leg swelling.  Psychiatric/Behavioral: Negative for sleep disturbance. The patient is not nervous/anxious.       Objective:    BP 128/82 mmHg  Pulse 63  Temp(Src) 98.3 F (36.8 C) (Oral)  Resp 18  Ht 5\' 8"  (1.727 m)  Wt 242 lb 1.9 oz (109.825 kg)  BMI 36.82 kg/m2  SpO2 96% Nursing note and vital signs reviewed.  Physical Exam  Constitutional: He is oriented to person, place, and time. He appears well-developed and well-nourished. No distress.  Cardiovascular: Normal rate, regular  rhythm, normal heart sounds and intact distal pulses.   Pulmonary/Chest: Effort normal and breath sounds normal.  Neurological: He is alert and oriented to person, place, and time.  Skin: Skin is warm and dry.  Psychiatric: He has a normal mood and affect. His behavior is normal. Judgment and thought content normal.       Assessment & Plan:   Problem List Items Addressed This Visit      Other   Obesity    Currently working on improving nutrition and physical activity as part of lifestyle changes.       SMOKER - Primary    Smoking cessation has been successful with no adverse effects of the Zyban and has not smoked in about 2-3 weeks since starting the medication. Continue current dose of Zyban. Discussed therapy goals and that he can stop taking the medication after 7 weeks if interested or may continue up to 3 months if needed. Re-affirmed resources to use for smoking cessation. Follow up as needed.       Encounter for tobacco use cessation counseling

## 2015-01-31 ENCOUNTER — Other Ambulatory Visit: Payer: Self-pay | Admitting: Family

## 2015-01-31 DIAGNOSIS — Z0000 Encounter for general adult medical examination without abnormal findings: Secondary | ICD-10-CM

## 2015-01-31 MED ORDER — BUPROPION HCL ER (SMOKING DET) 150 MG PO TB12: ORAL_TABLET | ORAL | Status: DC

## 2015-01-31 NOTE — Addendum Note (Signed)
Addended by: Jeanine LuzALONE, Tamber Burtch D on: 01/31/2015 01:04 PM   Modules accepted: Orders

## 2017-05-19 ENCOUNTER — Ambulatory Visit: Payer: BLUE CROSS/BLUE SHIELD | Admitting: Family Medicine

## 2017-07-27 DIAGNOSIS — Z720 Tobacco use: Secondary | ICD-10-CM | POA: Diagnosis not present

## 2017-07-27 DIAGNOSIS — Z1331 Encounter for screening for depression: Secondary | ICD-10-CM | POA: Diagnosis not present

## 2017-07-27 DIAGNOSIS — Z1389 Encounter for screening for other disorder: Secondary | ICD-10-CM | POA: Diagnosis not present

## 2017-07-27 DIAGNOSIS — E668 Other obesity: Secondary | ICD-10-CM | POA: Diagnosis not present

## 2017-07-27 DIAGNOSIS — Z0000 Encounter for general adult medical examination without abnormal findings: Secondary | ICD-10-CM | POA: Diagnosis not present

## 2017-07-28 ENCOUNTER — Other Ambulatory Visit: Payer: Self-pay | Admitting: Internal Medicine

## 2017-07-28 DIAGNOSIS — R7401 Elevation of levels of liver transaminase levels: Secondary | ICD-10-CM

## 2017-07-28 DIAGNOSIS — R740 Nonspecific elevation of levels of transaminase and lactic acid dehydrogenase [LDH]: Principal | ICD-10-CM

## 2017-08-10 ENCOUNTER — Ambulatory Visit
Admission: RE | Admit: 2017-08-10 | Discharge: 2017-08-10 | Disposition: A | Discharge status: Home / Self Care | Payer: BLUE CROSS/BLUE SHIELD | Source: Ambulatory Visit | Attending: Internal Medicine | Admitting: Internal Medicine

## 2017-08-10 DIAGNOSIS — K7689 Other specified diseases of liver: Secondary | ICD-10-CM | POA: Diagnosis not present

## 2017-08-10 DIAGNOSIS — R7401 Elevation of levels of liver transaminase levels: Secondary | ICD-10-CM

## 2017-08-10 DIAGNOSIS — K824 Cholesterolosis of gallbladder: Secondary | ICD-10-CM

## 2017-08-10 DIAGNOSIS — K760 Fatty (change of) liver, not elsewhere classified: Secondary | ICD-10-CM

## 2017-08-10 DIAGNOSIS — R740 Nonspecific elevation of levels of transaminase and lactic acid dehydrogenase [LDH]: Principal | ICD-10-CM

## 2017-08-10 HISTORY — DX: Fatty (change of) liver, not elsewhere classified: K76.0

## 2017-08-10 HISTORY — DX: Cholesterolosis of gallbladder: K82.4

## 2017-08-10 HISTORY — DX: Other specified diseases of liver: K76.89

## 2017-09-28 DIAGNOSIS — K824 Cholesterolosis of gallbladder: Secondary | ICD-10-CM | POA: Diagnosis not present

## 2017-10-07 ENCOUNTER — Ambulatory Visit: Payer: Self-pay | Admitting: General Surgery

## 2017-11-25 ENCOUNTER — Encounter (HOSPITAL_BASED_OUTPATIENT_CLINIC_OR_DEPARTMENT_OTHER): Payer: Self-pay

## 2017-11-29 ENCOUNTER — Encounter (HOSPITAL_COMMUNITY)
Admission: RE | Admit: 2017-11-29 | Discharge: 2017-11-29 | Disposition: A | Discharge status: Home / Self Care | Payer: BLUE CROSS/BLUE SHIELD | Source: Ambulatory Visit | Attending: General Surgery | Admitting: General Surgery

## 2017-11-29 ENCOUNTER — Encounter (HOSPITAL_BASED_OUTPATIENT_CLINIC_OR_DEPARTMENT_OTHER): Payer: Self-pay | Admitting: *Deleted

## 2017-11-29 ENCOUNTER — Other Ambulatory Visit: Payer: Self-pay

## 2017-11-29 DIAGNOSIS — Z01812 Encounter for preprocedural laboratory examination: Secondary | ICD-10-CM | POA: Diagnosis not present

## 2017-11-29 LAB — CBC WITH DIFFERENTIAL/PLATELET
ABS IMMATURE GRANULOCYTES: 0.02 10*3/uL (ref 0.00–0.07)
BASOS PCT: 1 %
Basophils Absolute: 0.1 10*3/uL (ref 0.0–0.1)
Eosinophils Absolute: 0.2 10*3/uL (ref 0.0–0.5)
Eosinophils Relative: 3 %
HEMATOCRIT: 43.8 % (ref 39.0–52.0)
Hemoglobin: 15.2 g/dL (ref 13.0–17.0)
IMMATURE GRANULOCYTES: 0 %
LYMPHS ABS: 1.8 10*3/uL (ref 0.7–4.0)
Lymphocytes Relative: 31 %
MCH: 31.3 pg (ref 26.0–34.0)
MCHC: 34.7 g/dL (ref 30.0–36.0)
MCV: 90.3 fL (ref 80.0–100.0)
MONO ABS: 0.3 10*3/uL (ref 0.1–1.0)
MONOS PCT: 5 %
NEUTROS ABS: 3.5 10*3/uL (ref 1.7–7.7)
Neutrophils Relative %: 60 %
PLATELETS: 180 10*3/uL (ref 150–400)
RBC: 4.85 MIL/uL (ref 4.22–5.81)
RDW: 12.1 % (ref 11.5–15.5)
WBC: 5.7 10*3/uL (ref 4.0–10.5)
nRBC: 0 % (ref 0.0–0.2)

## 2017-11-29 LAB — COMPREHENSIVE METABOLIC PANEL
ALT: 91 U/L — AB (ref 0–44)
AST: 52 U/L — AB (ref 15–41)
Albumin: 4.1 g/dL (ref 3.5–5.0)
Alkaline Phosphatase: 60 U/L (ref 38–126)
Anion gap: 9 (ref 5–15)
BILIRUBIN TOTAL: 1.7 mg/dL — AB (ref 0.3–1.2)
BUN: 14 mg/dL (ref 6–20)
CALCIUM: 9.3 mg/dL (ref 8.9–10.3)
CHLORIDE: 103 mmol/L (ref 98–111)
CO2: 27 mmol/L (ref 22–32)
CREATININE: 0.98 mg/dL (ref 0.61–1.24)
Glucose, Bld: 160 mg/dL — ABNORMAL HIGH (ref 70–99)
Potassium: 3.9 mmol/L (ref 3.5–5.1)
Sodium: 139 mmol/L (ref 135–145)
TOTAL PROTEIN: 7.1 g/dL (ref 6.5–8.1)

## 2017-11-29 NOTE — Progress Notes (Addendum)
ARRIVE 630 AM DAY OF SURGERY [facility]   NO SOLID FOOD AFTER MIDNIGHT THE NIGHT PRIOR TO SURGERY. NOTHING BY MOUTH EXCEPT CLEAR LIQUIDS UNTIL 3 HOURS PRIOR TO SCHEULED SURGERY. PLEASE FINISH ENSURE DRINK PER SURGEON ORDER 3 HOURS PRIOR TO SCHEDULED SURGERY TIME WHICH NEEDS TO BE COMPLETED AT 530 AM   CLEAR LIQUID DIET   Foods Allowed                                                                     Foods Excluded  Coffee and tea, regular and decaf                             liquids that you cannot  Plain Jell-O in any flavor                                             see through such as: Fruit ices (not with fruit pulp)                                     milk, soups, orange juice  Iced Popsicles                                    All solid food Carbonated beverages, regular and diet                                    Cranberry, grape and apple juices Sports drinks like Gatorade Lightly seasoned clear broth or consume(fat free) Sugar, honey syrup  Sample Menu Breakfast                                Lunch                                     Supper Cranberry juice                    Beef broth                            Chicken broth Jell-O                                     Grape juice                           Apple juice Coffee or tea                        Jell-O  Popsicle                                                Coffee or tea                        Coffee or tea  _____________________________________________________________________  [facility] - Preparing for Surgery Before surgery, you can play an important role.  Because skin is not sterile, your skin needs to be as free of germs as possible.  You can reduce the number of germs on your skin by washing with CHG (chlorahexidine gluconate) soap before surgery.  CHG is an antiseptic cleaner which kills germs and bonds with the skin to continue killing germs even after  washing. Please DO NOT use if you have an allergy to CHG or antibacterial soaps.  If your skin becomes reddened/irritated stop using the CHG and inform your nurse when you arrive at Short Stay. Do not shave (including legs and underarms) for at least 48 hours prior to the first CHG shower.  You may shave your face/neck. Please follow these instructions carefully:  1.  Shower with CHG Soap the night before surgery and the  morning of Surgery.  2.  If you choose to wash your hair, wash your hair first as usual with your  normal  shampoo.  3.  After you shampoo, rinse your hair and body thoroughly to remove the  shampoo.                           4.  Use CHG as you would any other liquid soap.  You can apply chg directly  to the skin and wash                       Gently with a scrungie or clean washcloth.  5.  Apply the CHG Soap to your body ONLY FROM THE NECK DOWN.   Do not use on face/ open                           Wound or open sores. Avoid contact with eyes, ears mouth and genitals (private parts).                       Wash face,  Genitals (private parts) with your normal soap.             6.  Wash thoroughly, paying special attention to the area where your surgery  will be performed.  7.  Thoroughly rinse your body with warm water from the neck down.  8.  DO NOT shower/wash with your normal soap after using and rinsing off  the CHG Soap.                9.  Pat yourself dry with a clean towel.            10.  Wear clean pajamas.            11.  Place clean sheets on your bed the night of your first shower and do not  sleep with pets. Day of Surgery : Do not apply any lotions/deodorants the morning of surgery.  Please wear clean clothes to the hospital/surgery  center.  FAILURE TO FOLLOW THESE INSTRUCTIONS MAY RESULT IN THE CANCELLATION OF YOUR SURGERY PATIENT SIGNATURE_________________________________  NURSE  SIGNATURE__________________________________  ________________________________________________________________________

## 2017-11-29 NOTE — Progress Notes (Signed)
SPOKE WITH [facility] NPO AFTER MIDNIGHT FOOD, CLEAR LIQUIDS UNTIL 530 AM AND DRINK PRE SURGERY SHAKE AT 530 AM THEN NPO. PATIENT INSTRTRUCTED ON HIBICLENS SHOWER HS AND AM OF SURGERY. MEDS TO TAKE : CHANTIX, ZYRTEC WIFE ELLIE DRIVER WILL STAY FOR SURGERY HAS SURGERY ORDERS IN Epic PATIENT COMING FOR PRE OP LAB APPOINTMENT 11-29-17 100 PM FOR CBC AND CMET .

## 2017-11-29 NOTE — Progress Notes (Addendum)
Cbc with dif, cmet done 11-29-17 on chart/epic

## 2017-11-30 NOTE — Anesthesia Preprocedure Evaluation (Addendum)
Anesthesia Evaluation  Patient identified by MRN, date of birth, ID band Patient awake    Reviewed: Allergy & Precautions, NPO status , Patient's Chart, lab work & pertinent test results  Airway Mallampati: IV  TM Distance: >3 FB Neck ROM: Full    Dental no notable dental hx.    Pulmonary asthma , former smoker,    Pulmonary exam normal breath sounds clear to auscultation       Cardiovascular negative cardio ROS Normal cardiovascular exam Rhythm:Regular Rate:Normal     Neuro/Psych  Headaches, PSYCHIATRIC DISORDERS Depression    GI/Hepatic negative GI ROS, Neg liver ROS,   Endo/Other  negative endocrine ROS  Renal/GU negative Renal ROS     Musculoskeletal negative musculoskeletal ROS (+)   Abdominal (+) + obese,   Peds  Hematology negative hematology ROS (+)   Anesthesia Other Findings gallbladder polyp  Reproductive/Obstetrics                            Anesthesia Physical Anesthesia Plan  ASA: II  Anesthesia Plan: General   Post-op Pain Management:    Induction: Intravenous  PONV Risk Score and Plan: 3 and Midazolam, Dexamethasone, Ondansetron and Treatment may vary due to age or medical condition  Airway Management Planned: Oral ETT  Additional Equipment:   Intra-op Plan:   Post-operative Plan: Extubation in OR  Informed Consent: I have reviewed the patients History and Physical, chart, labs and discussed the procedure including the risks, benefits and alternatives for the proposed anesthesia with the patient or authorized representative who has indicated his/her understanding and acceptance.   Dental advisory given  Plan Discussed with: CRNA  Anesthesia Plan Comments:        Anesthesia Quick Evaluation

## 2017-12-01 ENCOUNTER — Encounter (HOSPITAL_BASED_OUTPATIENT_CLINIC_OR_DEPARTMENT_OTHER): Payer: Self-pay | Admitting: *Deleted

## 2017-12-01 ENCOUNTER — Encounter (HOSPITAL_BASED_OUTPATIENT_CLINIC_OR_DEPARTMENT_OTHER)
Admission: RE | Disposition: A | Discharge status: Home / Self Care | Payer: Self-pay | Source: Ambulatory Visit | Attending: General Surgery

## 2017-12-01 ENCOUNTER — Ambulatory Visit (HOSPITAL_BASED_OUTPATIENT_CLINIC_OR_DEPARTMENT_OTHER)
Admission: RE | Admit: 2017-12-01 | Discharge: 2017-12-01 | Disposition: A | Discharge status: Home / Self Care | Payer: BLUE CROSS/BLUE SHIELD | Source: Ambulatory Visit | Attending: General Surgery | Admitting: General Surgery

## 2017-12-01 ENCOUNTER — Ambulatory Visit (HOSPITAL_BASED_OUTPATIENT_CLINIC_OR_DEPARTMENT_OTHER): Payer: BLUE CROSS/BLUE SHIELD | Admitting: Anesthesiology

## 2017-12-01 DIAGNOSIS — K824 Cholesterolosis of gallbladder: Secondary | ICD-10-CM | POA: Diagnosis not present

## 2017-12-01 DIAGNOSIS — Z79899 Other long term (current) drug therapy: Secondary | ICD-10-CM | POA: Diagnosis not present

## 2017-12-01 DIAGNOSIS — Z8379 Family history of other diseases of the digestive system: Secondary | ICD-10-CM | POA: Diagnosis not present

## 2017-12-01 DIAGNOSIS — Z87891 Personal history of nicotine dependence: Secondary | ICD-10-CM | POA: Insufficient documentation

## 2017-12-01 DIAGNOSIS — E669 Obesity, unspecified: Secondary | ICD-10-CM | POA: Insufficient documentation

## 2017-12-01 DIAGNOSIS — Z8249 Family history of ischemic heart disease and other diseases of the circulatory system: Secondary | ICD-10-CM | POA: Diagnosis not present

## 2017-12-01 DIAGNOSIS — K828 Other specified diseases of gallbladder: Secondary | ICD-10-CM | POA: Diagnosis not present

## 2017-12-01 DIAGNOSIS — K8010 Calculus of gallbladder with chronic cholecystitis without obstruction: Secondary | ICD-10-CM | POA: Insufficient documentation

## 2017-12-01 DIAGNOSIS — J45909 Unspecified asthma, uncomplicated: Secondary | ICD-10-CM | POA: Diagnosis not present

## 2017-12-01 DIAGNOSIS — K760 Fatty (change of) liver, not elsewhere classified: Secondary | ICD-10-CM | POA: Insufficient documentation

## 2017-12-01 DIAGNOSIS — J42 Unspecified chronic bronchitis: Secondary | ICD-10-CM | POA: Diagnosis not present

## 2017-12-01 DIAGNOSIS — K7689 Other specified diseases of liver: Secondary | ICD-10-CM | POA: Diagnosis not present

## 2017-12-01 DIAGNOSIS — R51 Headache: Secondary | ICD-10-CM | POA: Diagnosis not present

## 2017-12-01 DIAGNOSIS — Z6838 Body mass index (BMI) 38.0-38.9, adult: Secondary | ICD-10-CM | POA: Insufficient documentation

## 2017-12-01 DIAGNOSIS — F329 Major depressive disorder, single episode, unspecified: Secondary | ICD-10-CM | POA: Diagnosis not present

## 2017-12-01 HISTORY — DX: Migraine, unspecified, not intractable, without status migrainosus: G43.909

## 2017-12-01 HISTORY — DX: Cholesterolosis of gallbladder: K82.4

## 2017-12-01 HISTORY — PX: CHOLECYSTECTOMY: SHX55

## 2017-12-01 HISTORY — DX: Obesity, unspecified: E66.9

## 2017-12-01 HISTORY — DX: Other specified diseases of liver: K76.89

## 2017-12-01 HISTORY — DX: Fatty (change of) liver, not elsewhere classified: K76.0

## 2017-12-01 HISTORY — DX: Major depressive disorder, single episode, unspecified: F32.9

## 2017-12-01 HISTORY — DX: Depression, unspecified: F32.A

## 2017-12-01 HISTORY — DX: Other seasonal allergic rhinitis: J30.2

## 2017-12-01 SURGERY — LAPAROSCOPIC CHOLECYSTECTOMY
Anesthesia: General | Site: Abdomen

## 2017-12-01 MED ORDER — FENTANYL CITRATE (PF) 250 MCG/5ML IJ SOLN
INTRAMUSCULAR | Status: AC
  Filled 2017-12-01: qty 5

## 2017-12-01 MED ORDER — PROPOFOL 10 MG/ML IV BOLUS
INTRAVENOUS | Status: DC | PRN
  Administered 2017-12-01: 200 mg via INTRAVENOUS

## 2017-12-01 MED ORDER — OXYCODONE HCL 5 MG PO TABS
5.0000 mg | ORAL_TABLET | Freq: Once | ORAL | Status: DC | PRN
  Filled 2017-12-01: qty 1

## 2017-12-01 MED ORDER — DEXAMETHASONE SODIUM PHOSPHATE 10 MG/ML IJ SOLN
INTRAMUSCULAR | Status: AC
  Filled 2017-12-01: qty 1

## 2017-12-01 MED ORDER — ONDANSETRON HCL 4 MG/2ML IJ SOLN
INTRAMUSCULAR | Status: AC
  Filled 2017-12-01: qty 2

## 2017-12-01 MED ORDER — METOCLOPRAMIDE HCL 5 MG/ML IJ SOLN
INTRAMUSCULAR | Status: DC | PRN
  Administered 2017-12-01: 10 mg via INTRAVENOUS

## 2017-12-01 MED ORDER — SODIUM CHLORIDE 0.9 % IV SOLN
INTRAVENOUS | Status: AC
  Filled 2017-12-01: qty 2

## 2017-12-01 MED ORDER — GABAPENTIN 300 MG PO CAPS
300.0000 mg | ORAL_CAPSULE | ORAL | Status: AC
  Administered 2017-12-01: 300 mg via ORAL
  Filled 2017-12-01: qty 1

## 2017-12-01 MED ORDER — PROMETHAZINE HCL 25 MG/ML IJ SOLN
6.2500 mg | INTRAMUSCULAR | Status: DC | PRN
  Filled 2017-12-01: qty 1

## 2017-12-01 MED ORDER — MIDAZOLAM HCL 2 MG/2ML IJ SOLN
INTRAMUSCULAR | Status: AC
  Filled 2017-12-01: qty 2

## 2017-12-01 MED ORDER — LIDOCAINE 2% (20 MG/ML) 5 ML SYRINGE
INTRAMUSCULAR | Status: AC
  Filled 2017-12-01: qty 5

## 2017-12-01 MED ORDER — SUGAMMADEX SODIUM 200 MG/2ML IV SOLN
INTRAVENOUS | Status: AC
  Filled 2017-12-01: qty 2

## 2017-12-01 MED ORDER — IBUPROFEN 800 MG PO TABS: 800.0000 mg | ORAL_TABLET | Freq: Three times a day (TID) | ORAL | 0 refills | Status: DC | PRN

## 2017-12-01 MED ORDER — ACETAMINOPHEN 500 MG PO TABS
ORAL_TABLET | ORAL | Status: AC
  Filled 2017-12-01: qty 2

## 2017-12-01 MED ORDER — ROCURONIUM BROMIDE 100 MG/10ML IV SOLN
INTRAVENOUS | Status: DC | PRN
  Administered 2017-12-01: 60 mg via INTRAVENOUS

## 2017-12-01 MED ORDER — GABAPENTIN 300 MG PO CAPS
ORAL_CAPSULE | ORAL | Status: AC
  Filled 2017-12-01: qty 1

## 2017-12-01 MED ORDER — OXYCODONE HCL 5 MG/5ML PO SOLN
5.0000 mg | Freq: Once | ORAL | Status: DC | PRN
  Filled 2017-12-01: qty 5

## 2017-12-01 MED ORDER — SODIUM CHLORIDE 0.9 % IV SOLN
2.0000 g | INTRAVENOUS | Status: AC
  Administered 2017-12-01: 2 g via INTRAVENOUS
  Filled 2017-12-01: qty 2

## 2017-12-01 MED ORDER — ONDANSETRON HCL 4 MG/2ML IJ SOLN
INTRAMUSCULAR | Status: DC | PRN
  Administered 2017-12-01: 4 mg via INTRAVENOUS

## 2017-12-01 MED ORDER — LIDOCAINE HCL (CARDIAC) PF 100 MG/5ML IV SOSY
PREFILLED_SYRINGE | INTRAVENOUS | Status: DC | PRN
  Administered 2017-12-01: 100 mg via INTRAVENOUS

## 2017-12-01 MED ORDER — CELECOXIB 400 MG PO CAPS
400.0000 mg | ORAL_CAPSULE | ORAL | Status: AC
  Administered 2017-12-01: 400 mg via ORAL
  Filled 2017-12-01: qty 1

## 2017-12-01 MED ORDER — DEXAMETHASONE SODIUM PHOSPHATE 4 MG/ML IJ SOLN
INTRAMUSCULAR | Status: DC | PRN
  Administered 2017-12-01: 10 mg via INTRAVENOUS

## 2017-12-01 MED ORDER — ACETAMINOPHEN 500 MG PO TABS
1000.0000 mg | ORAL_TABLET | ORAL | Status: AC
  Administered 2017-12-01: 1000 mg via ORAL
  Filled 2017-12-01: qty 2

## 2017-12-01 MED ORDER — HYDROMORPHONE HCL 1 MG/ML IJ SOLN
0.2500 mg | INTRAMUSCULAR | Status: DC | PRN
  Filled 2017-12-01: qty 0.5

## 2017-12-01 MED ORDER — CHLORHEXIDINE GLUCONATE CLOTH 2 % EX PADS
6.0000 | MEDICATED_PAD | Freq: Once | CUTANEOUS | Status: DC
  Filled 2017-12-01: qty 6

## 2017-12-01 MED ORDER — MIDAZOLAM HCL 5 MG/5ML IJ SOLN
INTRAMUSCULAR | Status: DC | PRN
  Administered 2017-12-01: 2 mg via INTRAVENOUS

## 2017-12-01 MED ORDER — ROCURONIUM BROMIDE 10 MG/ML (PF) SYRINGE
PREFILLED_SYRINGE | INTRAVENOUS | Status: AC
  Filled 2017-12-01: qty 10

## 2017-12-01 MED ORDER — ALUM & MAG HYDROXIDE-SIMETH 200-200-20 MG/5ML PO SUSP
30.0000 mL | Freq: Once | ORAL | Status: AC
  Administered 2017-12-01: 30 mL via ORAL
  Filled 2017-12-01 (×2): qty 30

## 2017-12-01 MED ORDER — METOCLOPRAMIDE HCL 5 MG/ML IJ SOLN
INTRAMUSCULAR | Status: AC
  Filled 2017-12-01: qty 2

## 2017-12-01 MED ORDER — FENTANYL CITRATE (PF) 100 MCG/2ML IJ SOLN
INTRAMUSCULAR | Status: DC | PRN
  Administered 2017-12-01: 100 ug via INTRAVENOUS
  Administered 2017-12-01 (×2): 50 ug via INTRAVENOUS

## 2017-12-01 MED ORDER — BUPIVACAINE HCL 0.5 % IJ SOLN
INTRAMUSCULAR | Status: DC | PRN
  Administered 2017-12-01: 30 mL

## 2017-12-01 MED ORDER — LACTATED RINGERS IV SOLN
INTRAVENOUS | Status: DC
  Administered 2017-12-01 (×2): via INTRAVENOUS
  Filled 2017-12-01: qty 1000

## 2017-12-01 MED ORDER — CELECOXIB 200 MG PO CAPS
ORAL_CAPSULE | ORAL | Status: AC
  Filled 2017-12-01: qty 2

## 2017-12-01 MED ORDER — SUGAMMADEX SODIUM 200 MG/2ML IV SOLN
INTRAVENOUS | Status: DC | PRN
  Administered 2017-12-01: 400 mg via INTRAVENOUS

## 2017-12-01 MED ORDER — PROPOFOL 500 MG/50ML IV EMUL
INTRAVENOUS | Status: AC
  Filled 2017-12-01: qty 50

## 2017-12-01 MED ORDER — HYDROCODONE-ACETAMINOPHEN 5-325 MG PO TABS: 1.0000 | ORAL_TABLET | Freq: Four times a day (QID) | ORAL | 0 refills | Status: DC | PRN

## 2017-12-01 MED ORDER — ENSURE PRE-SURGERY PO LIQD
296.0000 mL | Freq: Once | ORAL | Status: DC
  Filled 2017-12-01: qty 296

## 2017-12-01 SURGICAL SUPPLY — 41 items
APPLIER CLIP ROT 10 11.4 M/L (STAPLE)
CABLE HIGH FREQUENCY MONO STRZ (ELECTRODE) ×2 IMPLANT
CATH CHOLANG 76X19 KUMAR (CATHETERS) IMPLANT
CHLORAPREP W/TINT 26ML (MISCELLANEOUS) ×2 IMPLANT
CLIP APPLIE ROT 10 11.4 M/L (STAPLE) IMPLANT
CLIP VESOLOCK MED LG 6/CT (CLIP) ×4 IMPLANT
COVER WAND RF STERILE (DRAPES) ×2 IMPLANT
DECANTER SPIKE VIAL GLASS SM (MISCELLANEOUS) IMPLANT
DERMABOND ADVANCED (GAUZE/BANDAGES/DRESSINGS) ×1
DERMABOND ADVANCED .7 DNX12 (GAUZE/BANDAGES/DRESSINGS) ×1 IMPLANT
DRAIN CHANNEL 19F RND (DRAIN) IMPLANT
DRAPE C-ARM 42X72 X-RAY (DRAPES) IMPLANT
ELECT REM PT RETURN 9FT ADLT (ELECTROSURGICAL) ×2
ELECTRODE REM PT RTRN 9FT ADLT (ELECTROSURGICAL) ×1 IMPLANT
EVACUATOR SILICONE 100CC (DRAIN) IMPLANT
GLOVE BIOGEL PI IND STRL 7.0 (GLOVE) ×1 IMPLANT
GLOVE BIOGEL PI INDICATOR 7.0 (GLOVE) ×1
GLOVE SURG SS PI 7.0 STRL IVOR (GLOVE) ×2 IMPLANT
GOWN STRL REUS W/TWL LRG LVL3 (GOWN DISPOSABLE) ×2 IMPLANT
GRASPER SUT TROCAR 14GX15 (MISCELLANEOUS) IMPLANT
HEMOSTAT SNOW SURGICEL 2X4 (HEMOSTASIS) IMPLANT
IRRIG SUCT STRYKERFLOW 2 WTIP (MISCELLANEOUS) ×2
IRRIGATION SUCT STRKRFLW 2 WTP (MISCELLANEOUS) ×1 IMPLANT
IV CATH 14GX2 1/4 (CATHETERS) IMPLANT
NS IRRIG 500ML POUR BTL (IV SOLUTION) IMPLANT
PACK BASIN DAY SURGERY FS (CUSTOM PROCEDURE TRAY) ×2 IMPLANT
POUCH RETRIEVAL ECOSAC 10 (ENDOMECHANICALS) ×1 IMPLANT
POUCH RETRIEVAL ECOSAC 10MM (ENDOMECHANICALS) ×1
SCISSORS LAP 5X35 DISP (ENDOMECHANICALS) ×2 IMPLANT
STOPCOCK 4 WAY LG BORE MALE ST (IV SETS) IMPLANT
SUT ETHILON 2 0 PS N (SUTURE) IMPLANT
SUT MNCRL AB 4-0 PS2 18 (SUTURE) ×2 IMPLANT
SUT VICRYL 0 ENDOLOOP (SUTURE) IMPLANT
SUT VICRYL 0 UR6 27IN ABS (SUTURE) IMPLANT
TOWEL OR 17X24 6PK STRL BLUE (TOWEL DISPOSABLE) ×4 IMPLANT
TRAY LAPAROSCOPIC (CUSTOM PROCEDURE TRAY) ×2 IMPLANT
TROCAR BLADELESS OPT 12M 100M (ENDOMECHANICALS) IMPLANT
TROCAR BLADELESS OPT 5 100 (ENDOMECHANICALS) ×6 IMPLANT
TROCAR XCEL NON-BLD 11X100MML (ENDOMECHANICALS) ×2 IMPLANT
TUBING INSUF HEATED (TUBING) ×2 IMPLANT
WARMER LAPAROSCOPE (MISCELLANEOUS) IMPLANT

## 2017-12-01 NOTE — Discharge Instructions (Signed)

## 2017-12-01 NOTE — Transfer of Care (Signed)
Immediate Anesthesia Transfer of Care Note  Patient: [name] [name]  Procedure(s) Performed: LAPAROSCOPIC CHOLECYSTECTOMY ERAS PATHWAY (N/A Abdomen)  Patient Location: PACU  Anesthesia Type:General  Level of Consciousness: awake, alert , oriented and patient cooperative  Airway & Oxygen Therapy: Patient Spontanous Breathing and Patient connected to nasal cannula oxygen  Post-op Assessment: Report given to RN and Post -op Vital signs reviewed and stable  Post vital signs: Reviewed and stable  Last Vitals:  Vitals Value Taken Time  BP    Temp    Pulse    Resp    SpO2      Last Pain:  Vitals:   12/01/17 0635  TempSrc: Oral         Complications: No apparent anesthesia complications

## 2017-12-01 NOTE — Anesthesia Postprocedure Evaluation (Signed)
Anesthesia Post Note  Patient: Roselyn Bering  Procedure(s) Performed: LAPAROSCOPIC CHOLECYSTECTOMY ERAS PATHWAY (N/A Abdomen)     Patient location during evaluation: PACU Anesthesia Type: General Level of consciousness: awake and alert Pain management: pain level controlled Vital Signs Assessment: post-procedure vital signs reviewed and stable Respiratory status: spontaneous breathing, nonlabored ventilation, respiratory function stable and patient connected to nasal cannula oxygen Cardiovascular status: blood pressure returned to baseline and stable Postop Assessment: no apparent nausea or vomiting Anesthetic complications: no    Last Vitals:  Vitals:   12/01/17 1045 12/01/17 1215  BP: (!) 178/100 (!) 153/87  Pulse: (!) 59 67  Resp: 17 14  Temp:  37.1 C  SpO2: 93% 98%    Last Pain:  Vitals:   12/01/17 1200  TempSrc:   PainSc: 1                  Montina Dorrance P Milia Warth

## 2017-12-01 NOTE — H&P (Signed)
[name] [name] is an 37 y.o. male.   Chief Complaint: abdominal pain HPI: 37 yo male with abdominal pain and gallbladder polyp. He has had nausea and intermittent pain sometimes related to food.  Past Medical History:  Diagnosis Date  . Asthma    mild, no inhaler used  . Depression   . Fatty liver 08/10/2017  . Gallbladder polyp 08/10/2017  . Hepatic cyst 08/10/2017   Small right hepatic cyst of 9 mm   . Migraine    once none since  . Obesity   . Prostatitis, acute Spring '13   resolved  . Seasonal allergies   . Unspecified chronic bronchitis ([place])    long resolved. ? related to smoking    Past Surgical History:  Procedure Laterality Date  . hand laceration repair Left 01/14/2013   stitches  . mole excision     one on arm and one on scalp  . WISDOM TOOTH EXTRACTION      Family History  Problem Relation Age of Onset  . Crohn's disease Mother   . Healthy Father   . Hypertension Paternal Grandfather   . CAD Paternal Grandfather    Social History:  reports that he has quit smoking. His smoking use included cigarettes. He has a 15.00 pack-year smoking history. He has never used smokeless tobacco. He reports that he drinks alcohol. He reports that he does not use drugs.  Allergies: No Known Allergies  Medications Prior to Admission  Medication Sig Dispense Refill  . cetirizine (ZYRTEC) 10 MG tablet Take 10 mg by mouth daily.    . varenicline (CHANTIX) 1 MG tablet Take 1 mg by mouth 2 (two) times daily.      Results for orders placed or performed during the hospital encounter of 11/29/17 (from the past 48 hour(s))  CBC WITH DIFFERENTIAL     Status: None   Collection Time: 11/29/17  1:04 PM  Result Value Ref Range   WBC 5.7 4.0 - 10.5 K/uL   RBC 4.85 4.22 - 5.81 MIL/uL   Hemoglobin 15.2 13.0 - 17.0 g/dL   HCT 43.8 39.0 - 52.0 %   MCV 90.3 80.0 - 100.0 fL   MCH 31.3 26.0 - 34.0 pg   MCHC 34.7 30.0 - 36.0 g/dL   RDW 12.1 11.5 - 15.5 %   Platelets 180 150 - 400  K/uL   nRBC 0.0 0.0 - 0.2 %   Neutrophils Relative % 60 %   Neutro Abs 3.5 1.7 - 7.7 K/uL   Lymphocytes Relative 31 %   Lymphs Abs 1.8 0.7 - 4.0 K/uL   Monocytes Relative 5 %   Monocytes Absolute 0.3 0.1 - 1.0 K/uL   Eosinophils Relative 3 %   Eosinophils Absolute 0.2 0.0 - 0.5 K/uL   Basophils Relative 1 %   Basophils Absolute 0.1 0.0 - 0.1 K/uL   Immature Granulocytes 0 %   Abs Immature Granulocytes 0.02 0.00 - 0.07 K/uL    Comment: Performed at [facility], [place] [address], [place], [place] [address]  Comprehensive metabolic panel     Status: Abnormal   Collection Time: 11/29/17  1:04 PM  Result Value Ref Range   Sodium 139 135 - 145 mmol/L   Potassium 3.9 3.5 - 5.1 mmol/L   Chloride 103 98 - 111 mmol/L   CO2 27 22 - 32 mmol/L   Glucose, Bld 160 (H) 70 - 99 mg/dL   BUN 14 6 - 20 mg/dL   Creatinine, Ser  0.98 0.61 - 1.24 mg/dL   Calcium 9.3 8.9 - 10.3 mg/dL   Total Protein 7.1 6.5 - 8.1 g/dL   Albumin 4.1 3.5 - 5.0 g/dL   AST 52 (H) 15 - 41 U/L   ALT 91 (H) 0 - 44 U/L   Alkaline Phosphatase 60 38 - 126 U/L   Total Bilirubin 1.7 (H) 0.3 - 1.2 mg/dL   GFR calc non Af Amer >60 >60 mL/min   GFR calc Af Amer >60 >60 mL/min    Comment: (NOTE) The eGFR has been calculated using the CKD EPI equation. This calculation has not been validated in all clinical situations. eGFR's persistently <60 mL/min signify possible Chronic Kidney Disease.    Anion gap 9 5 - 15    Comment: Performed at [facility], [place] [address], [place], [place] [address]   No results found.  Review of Systems  Constitutional: Negative for chills and fever.  HENT: Negative for hearing loss.   Eyes: Negative for blurred vision and double vision.  Respiratory: Negative for cough and hemoptysis.   Cardiovascular: Negative for chest pain and palpitations.  Gastrointestinal: Positive for abdominal pain and nausea. Negative for vomiting.  Genitourinary: Negative for  dysuria and urgency.  Musculoskeletal: Negative for myalgias and neck pain.  Skin: Negative for itching and rash.  Neurological: Negative for dizziness, tingling and headaches.  Endo/Heme/Allergies: Does not bruise/bleed easily.  Psychiatric/Behavioral: Negative for depression and suicidal ideas.    Blood pressure (!) 142/77, pulse (!) 52, temperature 98.9 F (37.2 C), temperature source Oral, resp. rate 18, height '5\' 7"'  (1.702 m), weight 111.1 kg, SpO2 100 %. Physical Exam  Vitals reviewed. Constitutional: He is oriented to person, place, and time. He appears well-developed and well-nourished.  HENT:  Head: Normocephalic and atraumatic.  Eyes: Pupils are equal, round, and reactive to light. Conjunctivae and EOM are normal.  Neck: Normal range of motion. Neck supple.  Cardiovascular: Normal rate and regular rhythm.  Respiratory: Effort normal and breath sounds normal.  GI: Soft. Bowel sounds are normal. He exhibits no distension. There is no tenderness.  Musculoskeletal: Normal range of motion.  Neurological: He is alert and oriented to person, place, and time.  Skin: Skin is warm and dry.  Psychiatric: He has a normal mood and affect. His behavior is normal.     Assessment/Plan 37 yo male with gallbladder polyp -lap chole -planned outpatient procedure  Mickeal Skinner, MD 12/01/2017, 8:09 AM

## 2017-12-01 NOTE — Anesthesia Procedure Notes (Signed)
Procedure Name: Intubation Date/Time: 12/01/2017 8:42 AM Performed by: Justice Rocher, CRNA Pre-anesthesia Checklist: Patient identified, Emergency Drugs available, Suction available and Patient being monitored Patient Re-evaluated:Patient Re-evaluated prior to induction Oxygen Delivery Method: Circle system utilized Preoxygenation: Pre-oxygenation with 100% oxygen Induction Type: IV induction Ventilation: Mask ventilation without difficulty Laryngoscope Size: Mac and 4 Grade View: Grade II Tube type: Oral Tube size: 8.0 mm Number of attempts: 1 Airway Equipment and Method: Stylet and Oral airway Placement Confirmation: ETT inserted through vocal cords under direct vision,  positive ETCO2 and breath sounds checked- equal and bilateral Secured at: 24 cm Tube secured with: Tape Dental Injury: Teeth and Oropharynx as per pre-operative assessment  Comments: permanent retainer intact

## 2017-12-01 NOTE — Op Note (Signed)
PATIENT:  [name] [name]  37 y.o. male  PRE-OPERATIVE DIAGNOSIS:  gallbladder polyp  POST-OPERATIVE DIAGNOSIS:  gallbladder polyp  PROCEDURE:  Procedure(s): LAPAROSCOPIC CHOLECYSTECTOMY ERAS PATHWAY   SURGEON:  Surgeon(s): Lawton Dollinger, De Blanch, MD  ASSISTANT: none  ANESTHESIA:   local and general  Indications for procedure: ZAMARION LONGEST is a 37 y.o. male with symptoms of Abdominal pain consistent with gallbladder polyp, Confirmed by Ultrasound.  Description of procedure: The patient was brought into the operative suite, placed supine. Anesthesia was administered with endotracheal tube. Patient was strapped in place and foot board was secured. All pressure points were offloaded by foam padding. The patient was prepped and draped in the usual sterile fashion.  A small incision was made to the right of the umbilicus. A 5mm trocar was inserted into the peritoneal cavity with optical entry. Pneumoperitoneum was applied with high flow low pressure. 2 5mm trocars were placed in the RUQ. A 12mm trocar was placed in the subxiphoid space. Marcaine was infused to the subxiphoid space and lateral upper right abdomen in the transversus abdominis plane. Next the patient was placed in reverse trendelenberg. The gallbladder was blue and fatty in appearance with a small amount of adhesions to the infundibulum.  The gallbladder was retracted cephalad and lateral. The peritoneum was reflected off the infundibulum working lateral to medial. The cystic duct and cystic artery were identified and further dissection revealed a critical view. The cystic duct and cystic artery were doubly clipped and ligated.   The gallbladder was removed off the liver bed with cautery. The Gallbladder was placed in a specimen bag. The gallbladder fossa was irrigated and hemostasis was applied with cautery. The gallbladder was removed via the 12mm trocar. No dilation was required for removal, therefore no fascial closure was  performed. Pneumoperitoneum was removed, all trocar were removed. All incisions were closed with 4-0 monocryl subcuticular stitch. The patient woke from anesthesia and was brought to PACU in stable condition. All counts were correct  Findings: normal ductal anatomy  Specimen: gallbladder  Blood loss: <30 ml  Local anesthesia: 30 ml marcaine  Complications: none  PLAN OF CARE: Discharge to home after PACU  PATIENT DISPOSITION:  PACU - hemodynamically stable.  Images:       Feliciana Rossetti, M.D. General, Bariatric, & Minimally Invasive Surgery [facility]

## 2017-12-02 ENCOUNTER — Encounter (HOSPITAL_BASED_OUTPATIENT_CLINIC_OR_DEPARTMENT_OTHER): Payer: Self-pay | Admitting: General Surgery

## 2017-12-02 NOTE — Addendum Note (Signed)
Addendum  created 12/02/17 0703 by Francie Massing, CRNA   Charge Capture section accepted

## 2018-03-05 ENCOUNTER — Ambulatory Visit
Admission: EM | Admit: 2018-03-05 | Discharge: 2018-03-05 | Disposition: A | Discharge status: Home / Self Care | Payer: BLUE CROSS/BLUE SHIELD | Attending: Family Medicine | Admitting: Family Medicine

## 2018-03-05 ENCOUNTER — Other Ambulatory Visit: Payer: Self-pay

## 2018-03-05 DIAGNOSIS — A084 Viral intestinal infection, unspecified: Secondary | ICD-10-CM

## 2018-03-05 DIAGNOSIS — R030 Elevated blood-pressure reading, without diagnosis of hypertension: Secondary | ICD-10-CM | POA: Diagnosis not present

## 2018-03-05 MED ORDER — LIDOCAINE VISCOUS HCL 2 % MT SOLN
15.0000 mL | Freq: Once | OROMUCOSAL | Status: AC
  Administered 2018-03-05: 15 mL via ORAL

## 2018-03-05 MED ORDER — ALUM & MAG HYDROXIDE-SIMETH 200-200-20 MG/5ML PO SUSP
30.0000 mL | Freq: Once | ORAL | Status: AC
  Administered 2018-03-05: 30 mL via ORAL

## 2018-03-05 NOTE — Discharge Instructions (Signed)
I believe this is most likely viral gastroenteritis  Zofran for nausea, vomiting Gi cocktail given in the clinic  EKG looked okay Pease follow up with primary for elevated blood pressure.

## 2018-03-05 NOTE — ED Triage Notes (Signed)
Patient complains of emesis, diarrhea, and chills - onset last night. Hx of cholecystomy in November 2019. Patient took Zofran with no relief.

## 2018-03-05 NOTE — ED Provider Notes (Signed)
[facility]    CSN: [id number] Arrival date & time: 03/05/18  0820     History   Chief Complaint Chief Complaint  Patient presents with  . Emesis    HPI IYAAD EDSELL IV is a 38 y.o. male.   He is a 38 year old male with past medical history of asthma, depression, fatty liver, gallbladder polyp, migraine, prostatitis, chronic bronchitis. He presents with nausea, vomiting, diarrhea, chills, myalgias since last night.  He took Zofran and hasn't vomited since 6 am.  Denies any hematemesis or hematochezia. He does report recent contact with son having a stomach virus and someone at work having stomach virus symptoms.  The pain is mostly epigastric.  He has had multiple episodes of vomiting that has somewhat resolved.  He had some diarrhea before the vomiting started.  Patient is very hypertensive in clinic today. He does not currently taking any blood pressure medication.  Denies any recent traveling or sick contacts.  Patient reports that he does have a history of frequent  acid reflux and he did have a high fatty,  oily meal last night.  He does drink alcohol. He is a former smoker.   ROS per HPI      Past Medical History:  Diagnosis Date  . Asthma    mild, no inhaler used  . Depression   . Fatty liver 08/10/2017  . Gallbladder polyp 08/10/2017  . Hepatic cyst 08/10/2017   Small right hepatic cyst of 9 mm   . Migraine    once none since  . Obesity   . Prostatitis, acute Spring '13   resolved  . Seasonal allergies   . Unspecified chronic bronchitis (HCC)    long resolved. ? related to smoking    Patient Active Problem List   Diagnosis Date Noted  . Encounter for tobacco use cessation counseling 10/08/2014  . Acute upper respiratory infections of unspecified site 11/02/2012  . Migraine headache 06/17/2012  . Routine health maintenance 11/03/2011  . Obesity 11/17/2007  . SMOKER 11/17/2007    Past Surgical History:  Procedure Laterality Date  .  CHOLECYSTECTOMY N/A 12/01/2017   Procedure: LAPAROSCOPIC CHOLECYSTECTOMY ERAS PATHWAY;  Surgeon: Sheliah Hatch De Blanch, MD;  Location: [facility];  Service: General;  Laterality: N/A;  . hand laceration repair Left 01/14/2013   stitches  . mole excision     one on arm and one on scalp  . WISDOM TOOTH EXTRACTION         Home Medications    Prior to Admission medications   Medication Sig Start Date End Date Taking? Authorizing Provider  cetirizine (ZYRTEC) 10 MG tablet Take 10 mg by mouth daily.    [provider]  HYDROcodone-acetaminophen (NORCO/VICODIN) 5-325 MG tablet Take 1 tablet by mouth every 6 (six) hours as needed for moderate pain. 12/01/17   Kinsinger, De Blanch, MD  ibuprofen (ADVIL,MOTRIN) 800 MG tablet Take 1 tablet (800 mg total) by mouth every 8 (eight) hours as needed. 12/01/17   Kinsinger, De Blanch, MD  varenicline (CHANTIX) 1 MG tablet Take 1 mg by mouth 2 (two) times daily.    [provider]    Family History Family History  Problem Relation Age of Onset  . Crohn's disease Mother   . Healthy Father   . Hypertension Paternal Grandfather   . CAD Paternal Grandfather     Social History Social History   Tobacco Use  . Smoking status: Former Smoker    Packs/day: 1.00  Years: 15.00    Pack years: 15.00    Types: Cigarettes  . Smokeless tobacco: Never Used  . Tobacco comment: quit august 2019  Substance Use Topics  . Alcohol use: Yes    Comment: occ  . Drug use: No     Allergies   Patient has no known allergies.   Review of Systems Review of Systems   Physical Exam Triage Vital Signs ED Triage Vitals  Enc Vitals Group     BP 03/05/18 0834 (!) 179/114     Pulse Rate 03/05/18 0834 71     Resp 03/05/18 0834 12     Temp 03/05/18 0834 98.1 F (36.7 C)     Temp Source 03/05/18 0834 Oral     SpO2 03/05/18 0834 98 %     Weight 03/05/18 0836 240 lb (108.9 kg)     Height 03/05/18 0836 5\' 6"  (1.676 m)     Head  Circumference --      Peak Flow --      Pain Score 03/05/18 0835 7     Pain Loc --      Pain Edu? --      Excl. in GC? --    No data found.  Updated Vital Signs BP (!) 180/82 (BP Location: Left Arm)   Pulse 71   Temp 98.1 F (36.7 C) (Oral)   Resp 12   Ht 5\' 6"  (1.676 m)   Wt 240 lb (108.9 kg)   SpO2 98%   BMI 38.74 kg/m   Visual Acuity Right Eye Distance:   Left Eye Distance:   Bilateral Distance:    Right Eye Near:   Left Eye Near:    Bilateral Near:     Physical Exam Constitutional:      Appearance: He is ill-appearing.  HENT:     Head: Normocephalic and atraumatic.  Neck:     Musculoskeletal: Normal range of motion.  Cardiovascular:     Rate and Rhythm: Normal rate and regular rhythm.     Heart sounds: Normal heart sounds.  Pulmonary:     Effort: Pulmonary effort is normal.     Breath sounds: Normal breath sounds.  Abdominal:     General: There is no distension.     Palpations: Abdomen is soft. There is no mass.     Tenderness: There is abdominal tenderness. There is no guarding or rebound.     Hernia: No hernia is present.     Comments: Tenderness to epigastric area Mildly tender to left upper quadrant  Musculoskeletal: Normal range of motion.  Skin:    General: Skin is warm and dry.     Findings: No rash.  Psychiatric:        Mood and Affect: Mood normal.      UC Treatments / Results  Labs (all labs ordered are listed, but only abnormal results are displayed) Labs Reviewed - No data to display  EKG None  Radiology No results found.  Procedures Procedures (including critical care time)  Medications Ordered in UC Medications  alum & mag hydroxide-simeth (MAALOX/MYLANTA) 200-200-20 MG/5ML suspension 30 mL (30 mLs Oral Given 03/05/18 0932)    And  lidocaine (XYLOCAINE) 2 % viscous mouth solution 15 mL (15 mLs Oral Given 03/05/18 0932)    Initial Impression / Assessment and Plan / UC Course  I have reviewed the triage vital signs and the  nursing notes.  Pertinent labs & imaging results that were available during my care of the  patient were reviewed by me and considered in my medical decision making (see chart for details).    Viral gastroenteritis-  Patient is a 38 year old male the presents with nausea, vomiting, diarrhea since last night. He took Zofran and has not had any vomiting since 6:00 this morning. He is still having some mild upper abdominal discomfort EKG done in clinic based on patient's high blood pressure and location of pain. EKG without concern for ACS  GI cocktail given in clinic with some relief Instructed that he needs to see his primary care provider next week to recheck his blood pressure and do some routine blood work based on previous labs.  Pt agrees Instructed that if symptoms worsen he needs to go to the ER.    Final Clinical Impressions(s) / UC Diagnoses   Final diagnoses:  Viral gastroenteritis     Discharge Instructions     I believe this is most likely viral gastroenteritis  Zofran for nausea, vomiting Gi cocktail given in the clinic  EKG looked okay Pease follow up with primary for elevated blood pressure.     ED Prescriptions    None     Controlled Substance Prescriptions [place] Controlled Substance Registry consulted? Not Applicable   Janace Aris, NP 03/05/18 [phone]

## 2018-03-07 ENCOUNTER — Other Ambulatory Visit: Payer: Self-pay

## 2018-03-07 ENCOUNTER — Emergency Department (HOSPITAL_BASED_OUTPATIENT_CLINIC_OR_DEPARTMENT_OTHER)
Admission: EM | Admit: 2018-03-07 | Discharge: 2018-03-07 | Disposition: A | Discharge status: Home / Self Care | Payer: BLUE CROSS/BLUE SHIELD | Attending: Emergency Medicine | Admitting: Emergency Medicine

## 2018-03-07 ENCOUNTER — Encounter (HOSPITAL_BASED_OUTPATIENT_CLINIC_OR_DEPARTMENT_OTHER): Payer: Self-pay | Admitting: *Deleted

## 2018-03-07 ENCOUNTER — Emergency Department (HOSPITAL_BASED_OUTPATIENT_CLINIC_OR_DEPARTMENT_OTHER): Payer: BLUE CROSS/BLUE SHIELD

## 2018-03-07 DIAGNOSIS — R197 Diarrhea, unspecified: Secondary | ICD-10-CM | POA: Diagnosis not present

## 2018-03-07 DIAGNOSIS — R1032 Left lower quadrant pain: Secondary | ICD-10-CM | POA: Insufficient documentation

## 2018-03-07 DIAGNOSIS — K760 Fatty (change of) liver, not elsewhere classified: Secondary | ICD-10-CM | POA: Diagnosis not present

## 2018-03-07 DIAGNOSIS — R112 Nausea with vomiting, unspecified: Secondary | ICD-10-CM | POA: Diagnosis not present

## 2018-03-07 DIAGNOSIS — Z87891 Personal history of nicotine dependence: Secondary | ICD-10-CM | POA: Diagnosis not present

## 2018-03-07 DIAGNOSIS — Z79899 Other long term (current) drug therapy: Secondary | ICD-10-CM | POA: Diagnosis not present

## 2018-03-07 DIAGNOSIS — R109 Unspecified abdominal pain: Secondary | ICD-10-CM

## 2018-03-07 LAB — COMPREHENSIVE METABOLIC PANEL
ALT: 106 U/L — ABNORMAL HIGH (ref 0–44)
AST: 56 U/L — ABNORMAL HIGH (ref 15–41)
Albumin: 4.4 g/dL (ref 3.5–5.0)
Alkaline Phosphatase: 67 U/L (ref 38–126)
Anion gap: 10 (ref 5–15)
BILIRUBIN TOTAL: 1.9 mg/dL — AB (ref 0.3–1.2)
BUN: 17 mg/dL (ref 6–20)
CO2: 25 mmol/L (ref 22–32)
Calcium: 8.8 mg/dL — ABNORMAL LOW (ref 8.9–10.3)
Chloride: 101 mmol/L (ref 98–111)
Creatinine, Ser: 0.98 mg/dL (ref 0.61–1.24)
GFR calc Af Amer: >60 mL/min (ref >60)
Glucose, Bld: 137 mg/dL — ABNORMAL HIGH (ref 70–99)
POTASSIUM: 3.7 mmol/L (ref 3.5–5.1)
Sodium: 136 mmol/L (ref 135–145)
TOTAL PROTEIN: 7.6 g/dL (ref 6.5–8.1)

## 2018-03-07 LAB — CBC WITH DIFFERENTIAL/PLATELET
ABS IMMATURE GRANULOCYTES: 0.01 10*3/uL (ref 0.00–0.07)
BASOS PCT: 1 %
Basophils Absolute: 0.1 10*3/uL (ref 0.0–0.1)
EOS ABS: 0 10*3/uL (ref 0.0–0.5)
Eosinophils Relative: 0 %
HEMATOCRIT: 46.2 % (ref 39.0–52.0)
Hemoglobin: 16.2 g/dL (ref 13.0–17.0)
IMMATURE GRANULOCYTES: 0 %
Lymphocytes Relative: 15 %
Lymphs Abs: 1.1 10*3/uL (ref 0.7–4.0)
MCH: 31 pg (ref 26.0–34.0)
MCHC: 35.1 g/dL (ref 30.0–36.0)
MCV: 88.3 fL (ref 80.0–100.0)
MONOS PCT: 4 %
Monocytes Absolute: 0.3 10*3/uL (ref 0.1–1.0)
NEUTROS ABS: 5.9 10*3/uL (ref 1.7–7.7)
NEUTROS PCT: 80 %
PLATELETS: 180 10*3/uL (ref 150–400)
RBC: 5.23 MIL/uL (ref 4.22–5.81)
RDW: 11.9 % (ref 11.5–15.5)
WBC: 7.4 10*3/uL (ref 4.0–10.5)
nRBC: 0 % (ref 0.0–0.2)

## 2018-03-07 LAB — URINALYSIS, ROUTINE W REFLEX MICROSCOPIC
Bilirubin Urine: NEGATIVE
GLUCOSE, UA: NEGATIVE mg/dL
HGB URINE DIPSTICK: NEGATIVE
Ketones, ur: 40 mg/dL — AB
LEUKOCYTES UA: NEGATIVE
Nitrite: NEGATIVE
PH: 6 (ref 5.0–8.0)
Protein, ur: NEGATIVE mg/dL
Specific Gravity, Urine: 1.025 (ref 1.005–1.030)

## 2018-03-07 LAB — LIPASE, BLOOD: LIPASE: 29 U/L (ref 11–51)

## 2018-03-07 MED ORDER — PROMETHAZINE HCL 25 MG PO TABS: 25.0000 mg | ORAL_TABLET | Freq: Four times a day (QID) | ORAL | 0 refills | Status: DC | PRN

## 2018-03-07 MED ORDER — DICYCLOMINE HCL 20 MG PO TABS: 20.0000 mg | ORAL_TABLET | Freq: Three times a day (TID) | ORAL | 0 refills | Status: DC | PRN

## 2018-03-07 MED ORDER — MORPHINE SULFATE (PF) 4 MG/ML IV SOLN
4.0000 mg | Freq: Once | INTRAVENOUS | Status: AC
  Administered 2018-03-07: 4 mg via INTRAVENOUS
  Filled 2018-03-07: qty 1

## 2018-03-07 MED ORDER — KETOROLAC TROMETHAMINE 30 MG/ML IJ SOLN
30.0000 mg | Freq: Once | INTRAMUSCULAR | Status: AC
  Administered 2018-03-07: 30 mg via INTRAVENOUS
  Filled 2018-03-07: qty 1

## 2018-03-07 MED ORDER — ONDANSETRON HCL 4 MG/2ML IJ SOLN
4.0000 mg | Freq: Once | INTRAMUSCULAR | Status: AC
  Administered 2018-03-07: 4 mg via INTRAVENOUS
  Filled 2018-03-07: qty 2

## 2018-03-07 MED ORDER — SODIUM CHLORIDE 0.9 % IV BOLUS
1000.0000 mL | Freq: Once | INTRAVENOUS | Status: AC
  Administered 2018-03-07: 1000 mL via INTRAVENOUS

## 2018-03-07 NOTE — ED Notes (Signed)
Pt able to tolerate a few bites of crackers and a couple sips of water. Did endorse some nausea and abdominal pain. MD made aware.

## 2018-03-07 NOTE — ED Triage Notes (Signed)
C/o lower abd pain that started on Friday but worse since 2am this morning. Vomited times 3 this morning.  States he was seen at Urgent Care and given zofran. Last dose was at 2am. Denies any fevers or diarrhea. Denies urinary symptoms. Left testicle pain. Describes as a dull ache. States this started prior to Friday.

## 2018-03-07 NOTE — ED Notes (Signed)
Patient transported to CT 

## 2018-03-07 NOTE — ED Provider Notes (Signed)
  Physical Exam  BP (!) 161/92   Pulse 82   Temp 97.8 F (36.6 C)   Resp 20   Ht 5\' 7"  (1.702 m)   Wt 108.9 kg   SpO2 100%   BMI 37.59 kg/m   Physical Exam  ED Course/Procedures     Procedures  MDM  Received patient in signout.  Lab work reassuring.  CT scan reassuring.  LFTs are still mildly elevated.  Can be followed as an outpatient.  Will treat with Phenergan and Bentyl for symptomatic relief.  Discharge home       Benjiman Core, MD 03/07/18 [phone]

## 2018-03-07 NOTE — ED Provider Notes (Signed)
[facility] EMERGENCY DEPARTMENT Provider Note   CSN: [id number] Arrival date & time: 03/07/18  0559     History   Chief Complaint Chief Complaint  Patient presents with  . Abdominal Pain    HPI [name] [name] is a 38 y.o. male.  Patient is a 38 year old male with past medical history of cholecystectomy secondary to gallbladder polyp, asthma.  He presents today for evaluation of abdominal pain and vomiting.  He states that he has had a 2-day history of discomfort in the left lower abdomen with associated nausea and vomiting.  The pain radiates to his left testicle.  He denies any diarrhea or constipation.  He denies any fevers or chills.  He was seen at urgent care 2 days ago and was presumed to have gastroenteritis.  He is not improving with the Zofran prescribed there.  The history is provided by the patient.  Abdominal Pain  Pain location:  LLQ Pain quality: cramping   Pain radiates to:  L flank and scrotum Pain severity:  Moderate Onset quality:  Sudden Duration:  2 days Timing:  Constant Progression:  Worsening Chronicity:  New Relieved by:  Nothing Worsened by:  Nothing   Past Medical History:  Diagnosis Date  . Asthma    mild, no inhaler used  . Depression   . Fatty liver 08/10/2017  . Gallbladder polyp 08/10/2017  . Hepatic cyst 08/10/2017   Small right hepatic cyst of 9 mm   . Migraine    once none since  . Obesity   . Prostatitis, acute Spring '13   resolved  . Seasonal allergies   . Unspecified chronic bronchitis (HCC)    long resolved. ? related to smoking    Patient Active Problem List   Diagnosis Date Noted  . Encounter for tobacco use cessation counseling 10/08/2014  . Acute upper respiratory infections of unspecified site 11/02/2012  . Migraine headache 06/17/2012  . Routine health maintenance 11/03/2011  . Obesity 11/17/2007  . SMOKER 11/17/2007    Past Surgical History:  Procedure Laterality Date  . CHOLECYSTECTOMY  N/A 12/01/2017   Procedure: LAPAROSCOPIC CHOLECYSTECTOMY ERAS PATHWAY;  Surgeon: Sheliah Hatch De Blanch, MD;  Location: [facility];  Service: General;  Laterality: N/A;  . hand laceration repair Left 01/14/2013   stitches  . mole excision     one on arm and one on scalp  . WISDOM TOOTH EXTRACTION          Home Medications    Prior to Admission medications   Medication Sig Start Date End Date Taking? Authorizing Provider  cetirizine (ZYRTEC) 10 MG tablet Take 10 mg by mouth daily.    [provider]  HYDROcodone-acetaminophen (NORCO/VICODIN) 5-325 MG tablet Take 1 tablet by mouth every 6 (six) hours as needed for moderate pain. 12/01/17   Kinsinger, De Blanch, MD  ibuprofen (ADVIL,MOTRIN) 800 MG tablet Take 1 tablet (800 mg total) by mouth every 8 (eight) hours as needed. 12/01/17   Kinsinger, De Blanch, MD  varenicline (CHANTIX) 1 MG tablet Take 1 mg by mouth 2 (two) times daily.    [provider]    Family History Family History  Problem Relation Age of Onset  . Crohn's disease Mother   . Healthy Father   . Hypertension Paternal Grandfather   . CAD Paternal Grandfather     Social History Social History   Tobacco Use  . Smoking status: Former Smoker    Packs/day: 1.00    Years:  15.00    Pack years: 15.00    Types: Cigarettes  . Smokeless tobacco: Never Used  . Tobacco comment: quit august 2019  Substance Use Topics  . Alcohol use: Yes    Comment: occ  . Drug use: No     Allergies   Patient has no known allergies.   Review of Systems Review of Systems  Gastrointestinal: Positive for abdominal pain.  All other systems reviewed and are negative.    Physical Exam Updated Vital Signs BP (!) 161/92   Pulse 82   Temp 97.8 F (36.6 C)   Resp 20   Ht 5\' 7"  (1.702 m)   Wt 108.9 kg   SpO2 100%   BMI 37.59 kg/m   Physical Exam Vitals signs and nursing note reviewed.  Constitutional:      General: He is not in acute  distress.    Appearance: He is well-developed. He is not diaphoretic.  HENT:     Head: Normocephalic and atraumatic.  Neck:     Musculoskeletal: Normal range of motion and neck supple.  Cardiovascular:     Rate and Rhythm: Normal rate and regular rhythm.     Heart sounds: No murmur. No friction rub.  Pulmonary:     Effort: Pulmonary effort is normal. No respiratory distress.     Breath sounds: Normal breath sounds. No wheezing or rales.  Abdominal:     General: Bowel sounds are normal. There is no distension.     Palpations: Abdomen is soft.     Tenderness: There is abdominal tenderness in the left lower quadrant. There is no guarding or rebound.  Musculoskeletal: Normal range of motion.  Skin:    General: Skin is warm and dry.  Neurological:     Mental Status: He is alert and oriented to person, place, and time.     Coordination: Coordination normal.      ED Treatments / Results  Labs (all labs ordered are listed, but only abnormal results are displayed) Labs Reviewed  COMPREHENSIVE METABOLIC PANEL  LIPASE, BLOOD  CBC WITH DIFFERENTIAL/PLATELET    EKG None  Radiology No results found.  Procedures Procedures (including critical care time)  Medications Ordered in ED Medications  ondansetron (ZOFRAN) injection 4 mg (has no administration in time range)  ketorolac (TORADOL) 30 MG/ML injection 30 mg (has no administration in time range)  morphine 4 MG/ML injection 4 mg (has no administration in time range)     Initial Impression / Assessment and Plan / ED Course  I have reviewed the triage vital signs and the nursing notes.  Pertinent labs & imaging results that were available during my care of the patient were reviewed by me and considered in my medical decision making (see chart for details).  Patient presenting here with left lower quadrant pain that has been worsening over the past 2 days.  He has had some nausea and vomiting associated with it.  Patient's  work-up initiated and care will be signed out to the oncoming provider at shift change.  The results of his laboratory studies and CT scan are pending.  These results will be followed up by the oncoming provider and final disposition determined at that time.  Final Clinical Impressions(s) / ED Diagnoses   Final diagnoses:  None    ED Discharge Orders    None       Geoffery Lyons, MD 03/07/18 [phone]

## 2018-03-07 NOTE — ED Notes (Signed)
MD at bedside. 

## 2018-08-01 DIAGNOSIS — E669 Obesity, unspecified: Secondary | ICD-10-CM | POA: Diagnosis not present

## 2018-08-01 DIAGNOSIS — Z0000 Encounter for general adult medical examination without abnormal findings: Secondary | ICD-10-CM | POA: Diagnosis not present

## 2018-08-08 DIAGNOSIS — Z0000 Encounter for general adult medical examination without abnormal findings: Secondary | ICD-10-CM | POA: Diagnosis not present

## 2018-08-08 DIAGNOSIS — E669 Obesity, unspecified: Secondary | ICD-10-CM | POA: Diagnosis not present

## 2018-08-08 DIAGNOSIS — Z1331 Encounter for screening for depression: Secondary | ICD-10-CM | POA: Diagnosis not present

## 2018-08-08 DIAGNOSIS — R7989 Other specified abnormal findings of blood chemistry: Secondary | ICD-10-CM | POA: Diagnosis not present

## 2018-08-08 DIAGNOSIS — Z720 Tobacco use: Secondary | ICD-10-CM | POA: Diagnosis not present

## 2018-08-16 DIAGNOSIS — Z03818 Encounter for observation for suspected exposure to other biological agents ruled out: Secondary | ICD-10-CM | POA: Diagnosis not present

## 2018-10-13 DIAGNOSIS — Z23 Encounter for immunization: Secondary | ICD-10-CM | POA: Diagnosis not present

## 2018-10-13 DIAGNOSIS — R7989 Other specified abnormal findings of blood chemistry: Secondary | ICD-10-CM | POA: Diagnosis not present

## 2018-10-14 DIAGNOSIS — R7989 Other specified abnormal findings of blood chemistry: Secondary | ICD-10-CM | POA: Diagnosis not present

## 2018-10-19 ENCOUNTER — Other Ambulatory Visit: Payer: Self-pay | Admitting: Internal Medicine

## 2018-10-19 DIAGNOSIS — R7989 Other specified abnormal findings of blood chemistry: Secondary | ICD-10-CM

## 2018-10-26 ENCOUNTER — Ambulatory Visit
Admission: RE | Admit: 2018-10-26 | Discharge: 2018-10-26 | Disposition: A | Discharge status: Home / Self Care | Payer: BC Managed Care – PPO | Source: Ambulatory Visit | Attending: Internal Medicine | Admitting: Internal Medicine

## 2018-10-26 DIAGNOSIS — R748 Abnormal levels of other serum enzymes: Secondary | ICD-10-CM | POA: Diagnosis not present

## 2018-10-26 DIAGNOSIS — R7989 Other specified abnormal findings of blood chemistry: Secondary | ICD-10-CM

## 2018-11-23 DIAGNOSIS — R7989 Other specified abnormal findings of blood chemistry: Secondary | ICD-10-CM | POA: Diagnosis not present

## 2018-11-23 DIAGNOSIS — R7401 Elevation of levels of liver transaminase levels: Secondary | ICD-10-CM | POA: Diagnosis not present

## 2018-11-23 DIAGNOSIS — Z87891 Personal history of nicotine dependence: Secondary | ICD-10-CM | POA: Diagnosis not present

## 2018-11-23 DIAGNOSIS — Z9049 Acquired absence of other specified parts of digestive tract: Secondary | ICD-10-CM | POA: Diagnosis not present

## 2018-11-25 ENCOUNTER — Other Ambulatory Visit: Payer: Self-pay | Admitting: Internal Medicine

## 2018-11-25 DIAGNOSIS — H00022 Hordeolum internum right lower eyelid: Secondary | ICD-10-CM | POA: Diagnosis not present

## 2018-11-25 DIAGNOSIS — R7989 Other specified abnormal findings of blood chemistry: Secondary | ICD-10-CM

## 2018-11-25 DIAGNOSIS — H5711 Ocular pain, right eye: Secondary | ICD-10-CM | POA: Diagnosis not present

## 2018-11-28 DIAGNOSIS — R7989 Other specified abnormal findings of blood chemistry: Secondary | ICD-10-CM | POA: Diagnosis not present

## 2018-11-28 DIAGNOSIS — R7401 Elevation of levels of liver transaminase levels: Secondary | ICD-10-CM | POA: Diagnosis not present

## 2018-12-02 ENCOUNTER — Ambulatory Visit
Admission: RE | Admit: 2018-12-02 | Discharge: 2018-12-02 | Disposition: A | Discharge status: Home / Self Care | Payer: BLUE CROSS/BLUE SHIELD | Source: Ambulatory Visit | Attending: Internal Medicine | Admitting: Internal Medicine

## 2018-12-02 DIAGNOSIS — R7989 Other specified abnormal findings of blood chemistry: Secondary | ICD-10-CM

## 2018-12-14 DIAGNOSIS — R7401 Elevation of levels of liver transaminase levels: Secondary | ICD-10-CM | POA: Diagnosis not present

## 2018-12-14 DIAGNOSIS — R7989 Other specified abnormal findings of blood chemistry: Secondary | ICD-10-CM | POA: Diagnosis not present

## 2020-10-03 ENCOUNTER — Other Ambulatory Visit: Payer: Self-pay | Admitting: Internal Medicine

## 2020-10-03 DIAGNOSIS — R7989 Other specified abnormal findings of blood chemistry: Secondary | ICD-10-CM

## 2020-10-14 ENCOUNTER — Ambulatory Visit
Admission: RE | Admit: 2020-10-14 | Discharge: 2020-10-14 | Disposition: A | Discharge status: Home / Self Care | Payer: BLUE CROSS/BLUE SHIELD | Source: Ambulatory Visit | Attending: Internal Medicine | Admitting: Internal Medicine

## 2020-10-14 DIAGNOSIS — R7989 Other specified abnormal findings of blood chemistry: Secondary | ICD-10-CM

## 2020-10-28 IMAGING — US US ABDOMEN LIMITED
1 series · 14 of 25 positions shown · non-contrast
Comparison: August 10, 2017.

CLINICAL DATA: Elevated liver enzymes

EXAM:
ULTRASOUND ABDOMEN LIMITED RIGHT UPPER QUADRANT

[Series 1: us abdomen limited · 0.33mm/px · 14 of 31 slices shown]
[im 1/31]
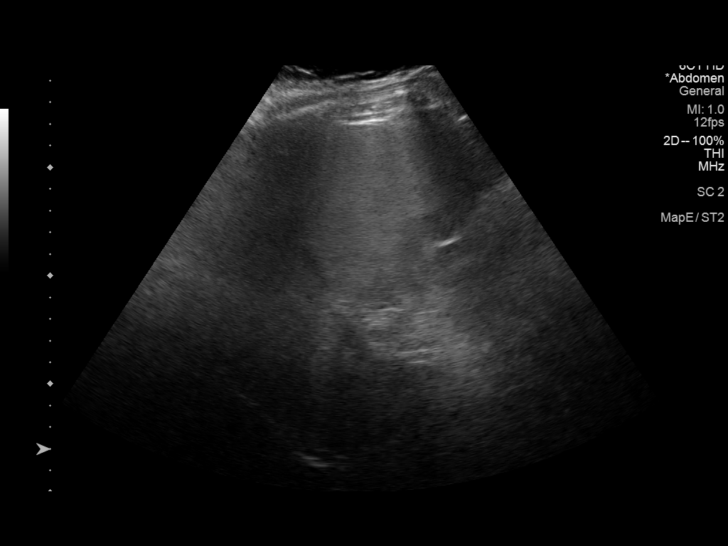
[im 3/31]
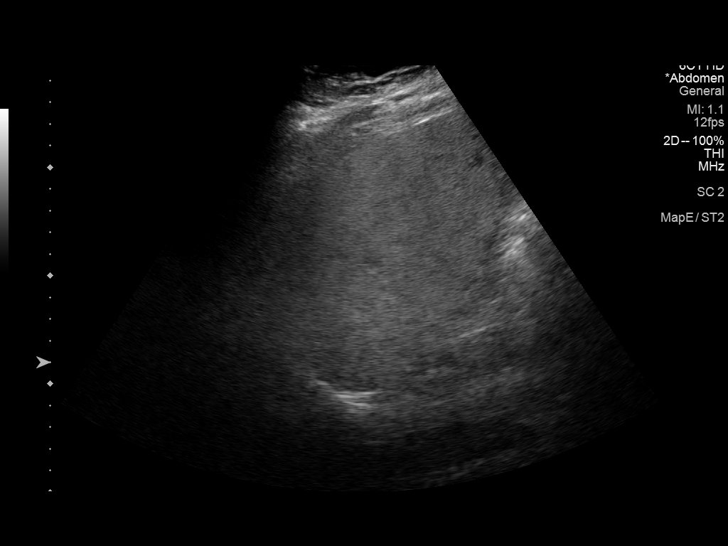
[im 6/31]
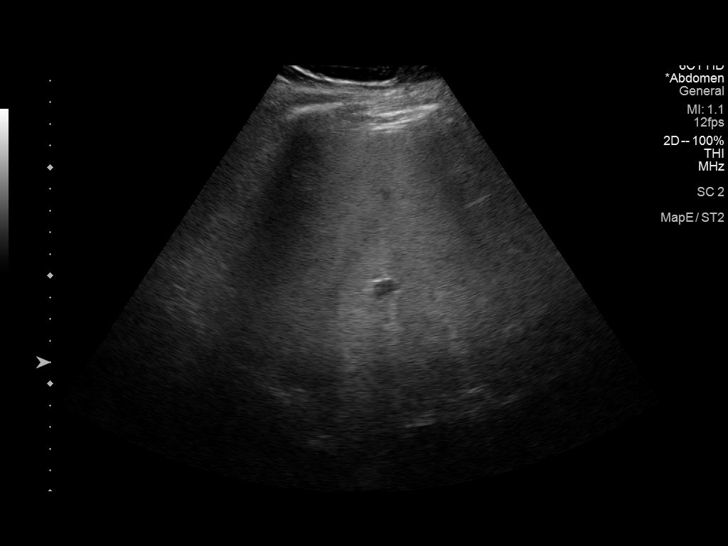
[im 8/31]
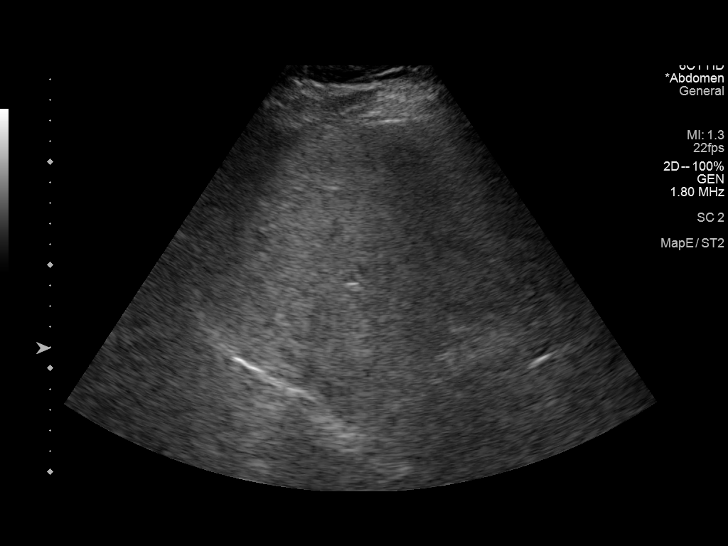
[im 11/31]
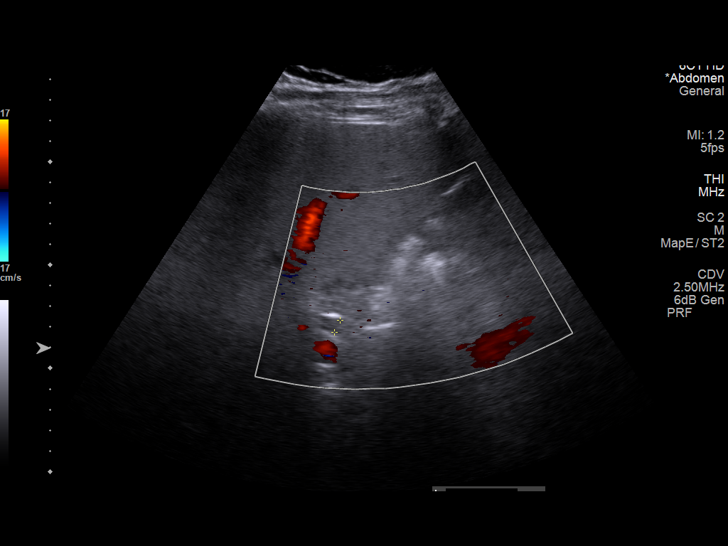
[im 12/31]
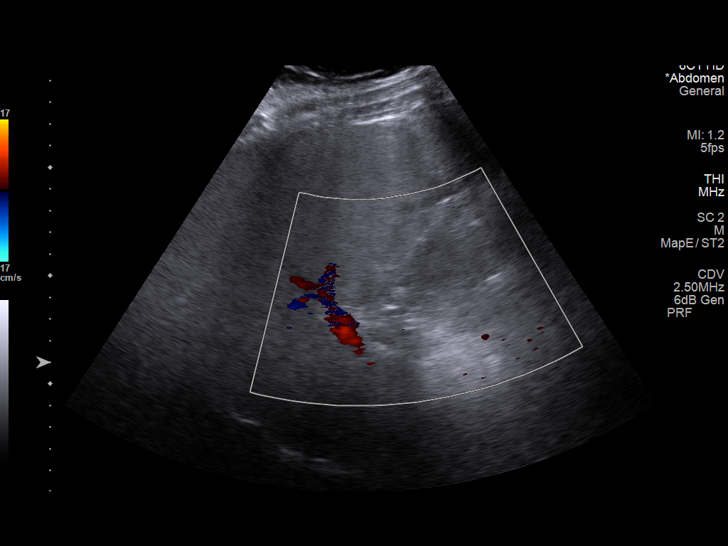
[im 14/31]
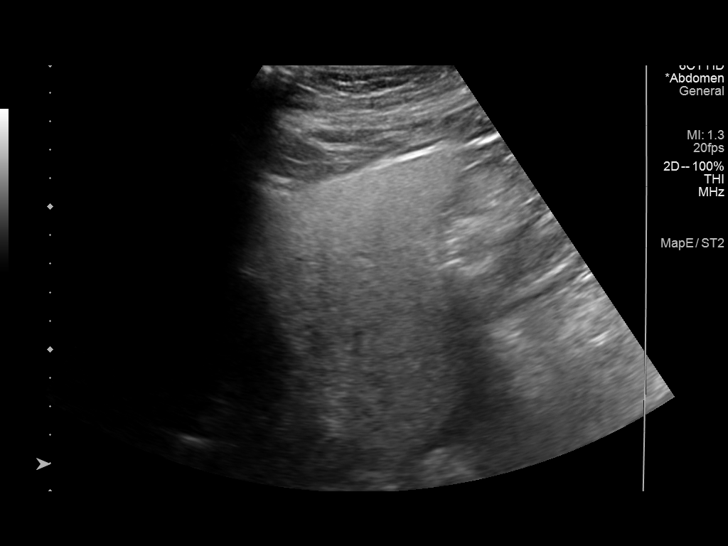
[im 17/31]
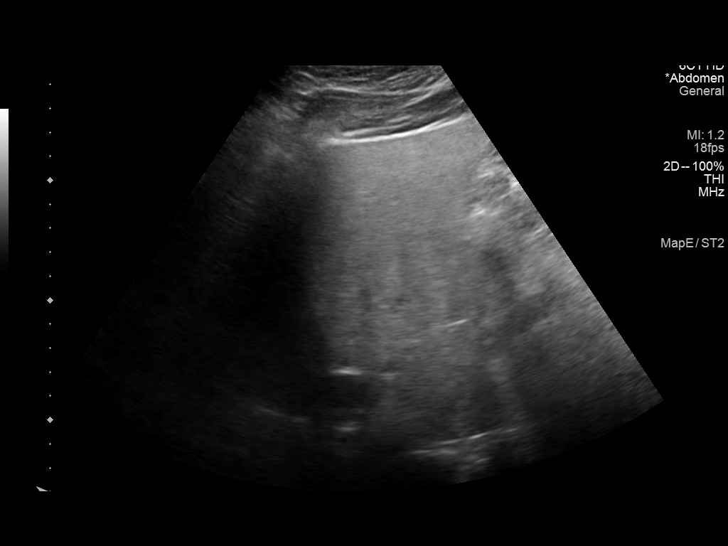
[im 19/31]
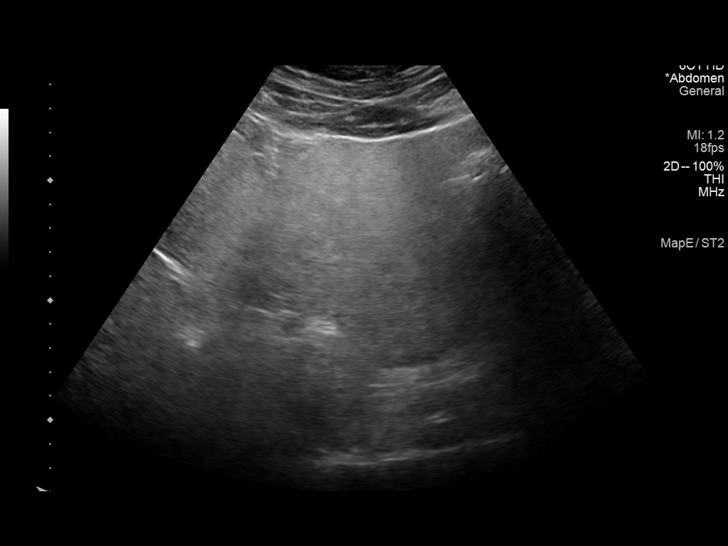
[im 21/31]
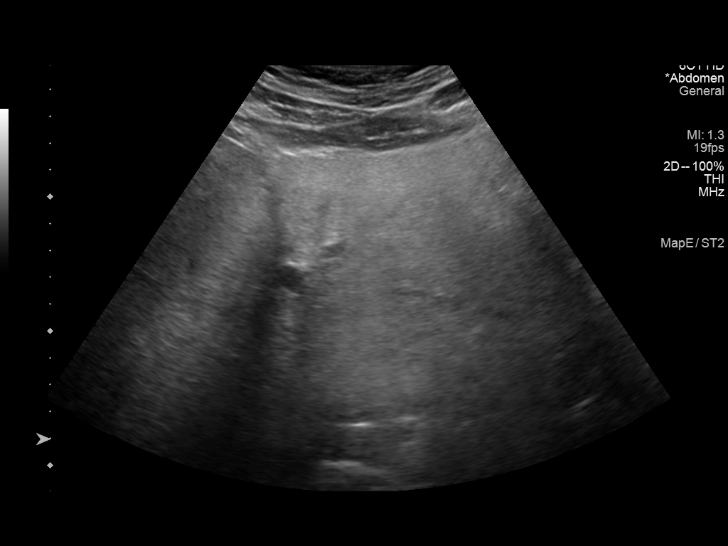
[im 23/31]
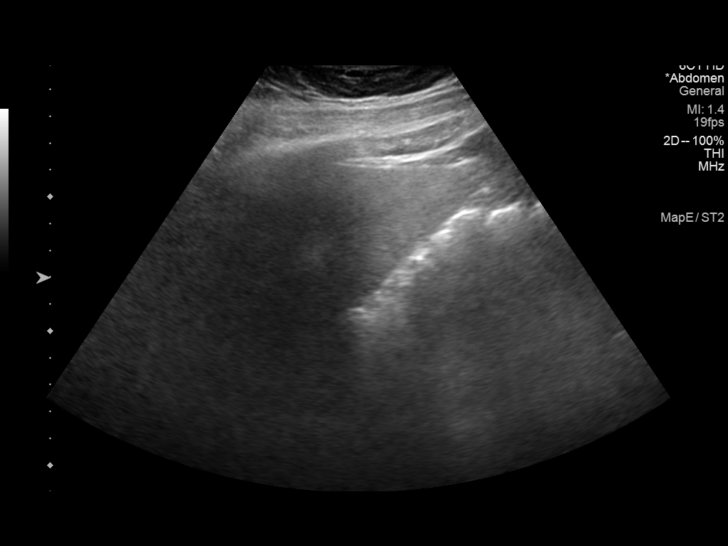
[im 26/31]
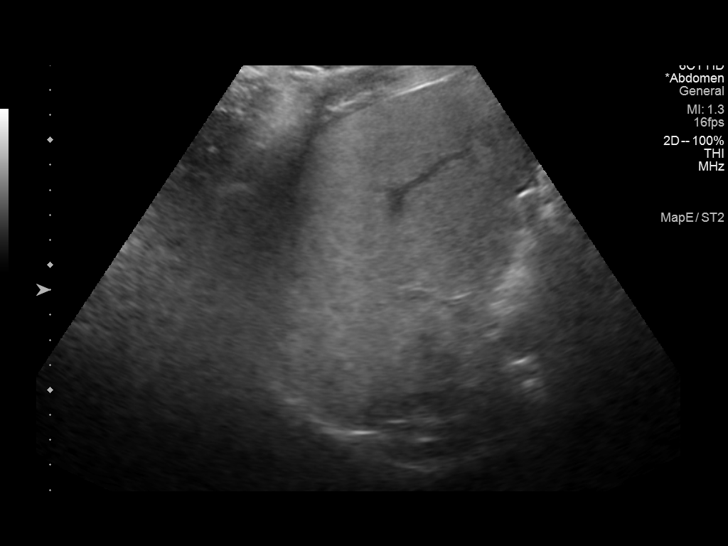
[im 28/31]
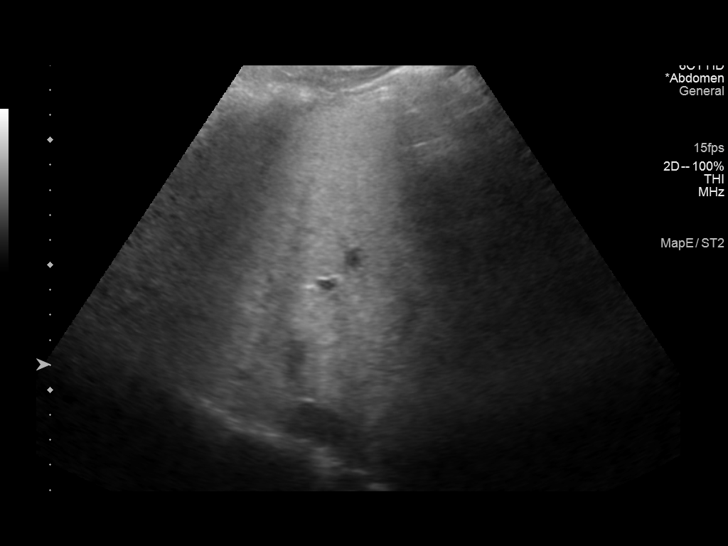
[im 31/31]
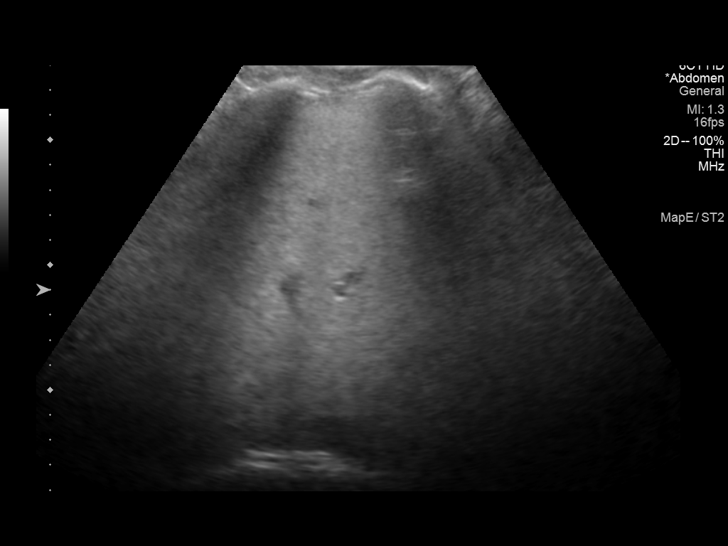

[14 of 25 positions shown; findings below may reference images not displayed]

FINDINGS: Gallbladder:

Surgically absent.

Common bile duct:

Diameter: 7 mm. No intrahepatic or extrahepatic biliary duct
dilatation.

Liver:

No focal lesion identified. Liver echogenicity is increased
diffusely. Portal vein is patent on color Doppler imaging with
normal direction of blood flow towards the liver.

Other: None.
IMPRESSION: 1. Diffuse increase in liver echogenicity, a finding indicative of
hepatic steatosis. A small cyst seen previously in the right lobe of
the liver is not appreciable on this current examination. Note that
the sensitivity of ultrasound for detection of focal liver lesions
is diminished significantly given this degree of abnormal liver
echogenicity.

2.  Gallbladder absent.

## 2020-12-04 IMAGING — US US ABDOMEN LIMITED W/ ELASTOGRAPHY
1 series · 12 of 25 positions shown · non-contrast
Comparison: None.

CLINICAL DATA: Elevated LFTs.  Status post cholecystectomy.

EXAM:
US ABDOMEN LIMITED - RIGHT UPPER QUADRANT
ULTRASOUND HEPATIC ELASTOGRAPHY
TECHNIQUE: Sonography of the right upper quadrant was performed. In addition,
ultrasound elastography evaluation of the liver was performed. A
region of interest was placed within the right lobe of the liver.
Following application of a compressive sonographic pulse, tissue
compressibility was assessed. Multiple assessments were performed at
the selected site. Median tissue compressibility was determined.
Previously, hepatic stiffness was assessed by shear wave velocity.
Based on recently published Society of Radiologists in Ultrasound
consensus article, reporting is now recommended to be performed in
the SI units of pressure (kiloPascals) representing hepatic
stiffness/elasticity. The obtained result is compared to the
published reference standards. (cACLD= compensated Advanced Chronic
Liver Disease)

[Series 1: us abdomen limited w/ elastography · 0.22mm/px · 12 of 33 slices shown]
[im 2/33]
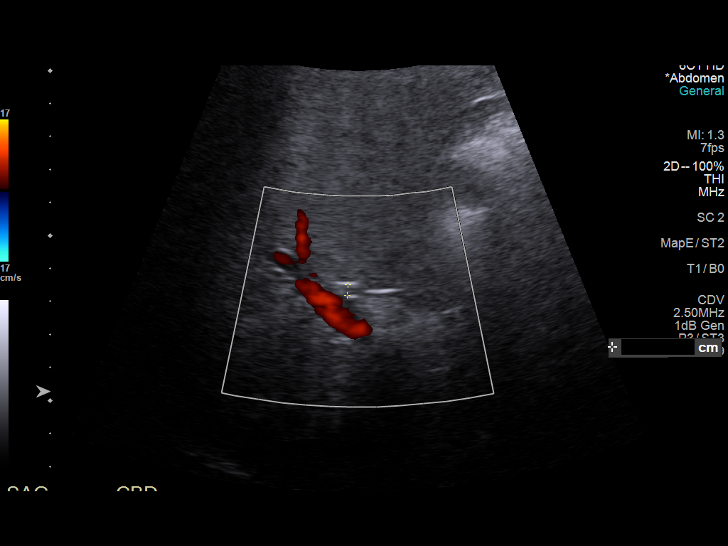
[im 5/33]
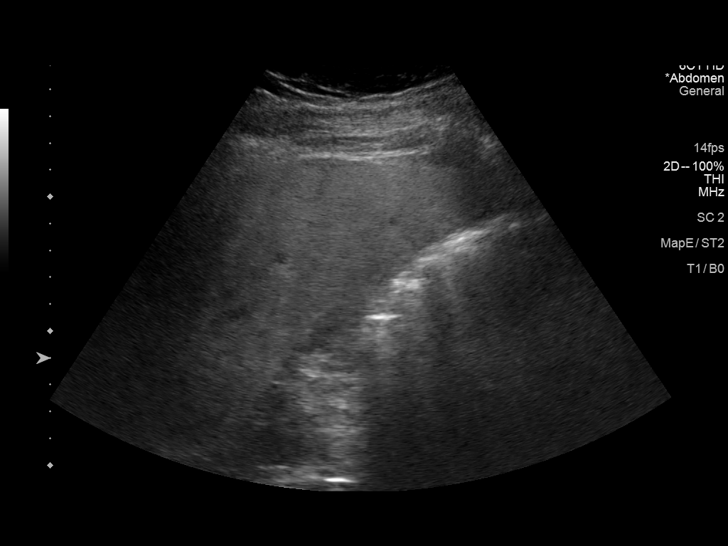
[im 7/33]
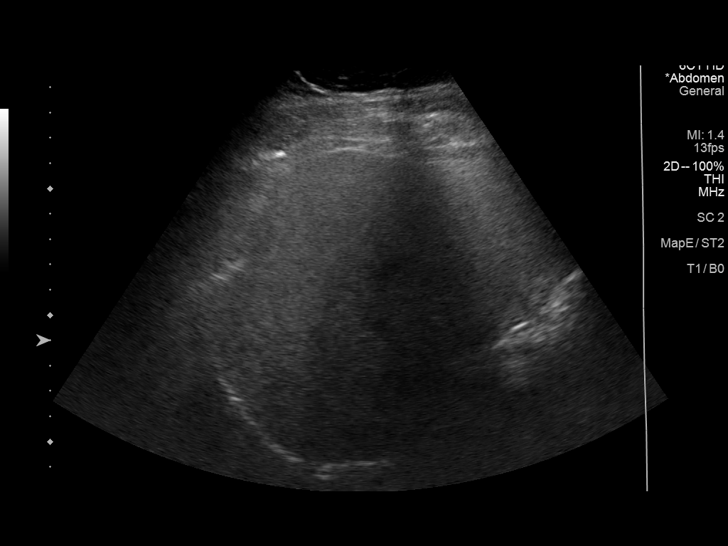
[im 10/33]
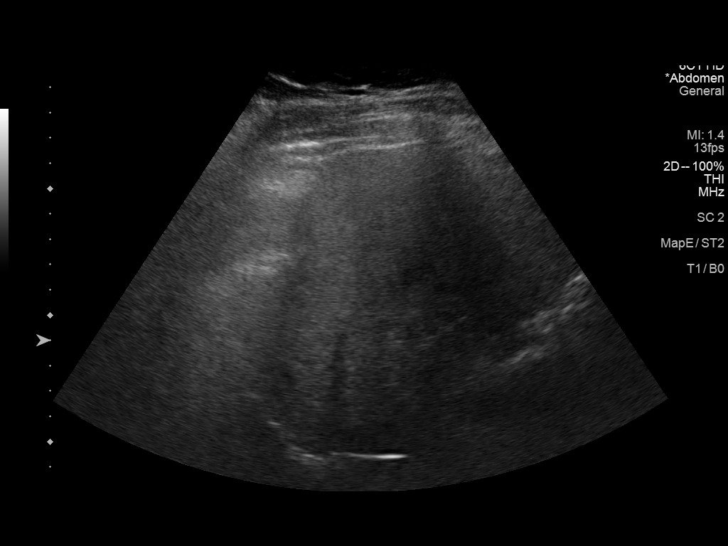
[im 13/33]
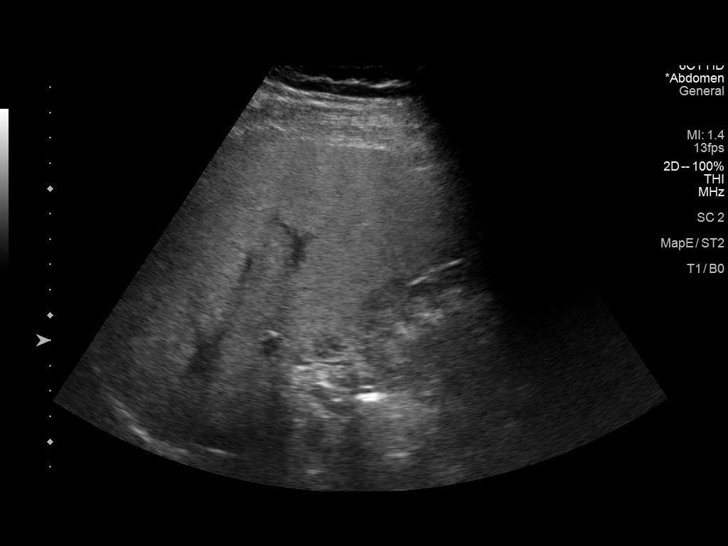
[im 15/33]
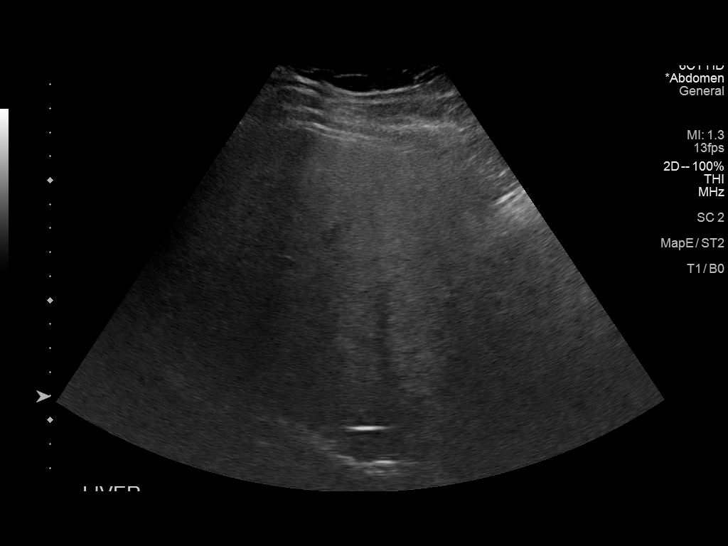
[im 18/33]
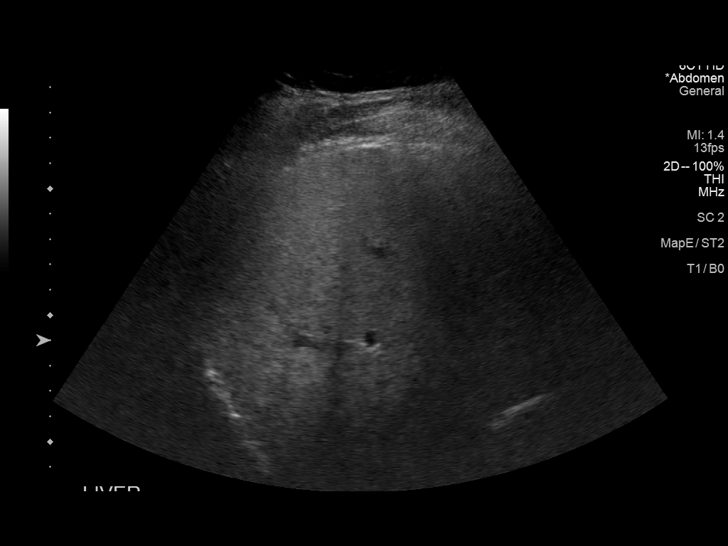
[im 21/33]
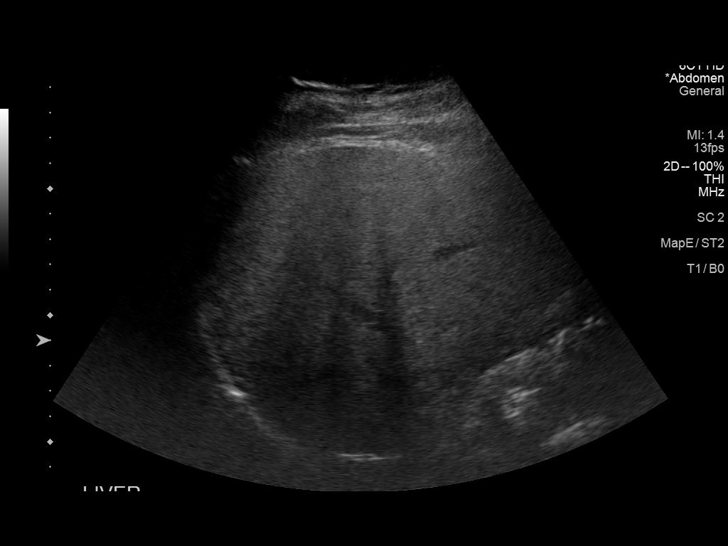
[im 23/33]
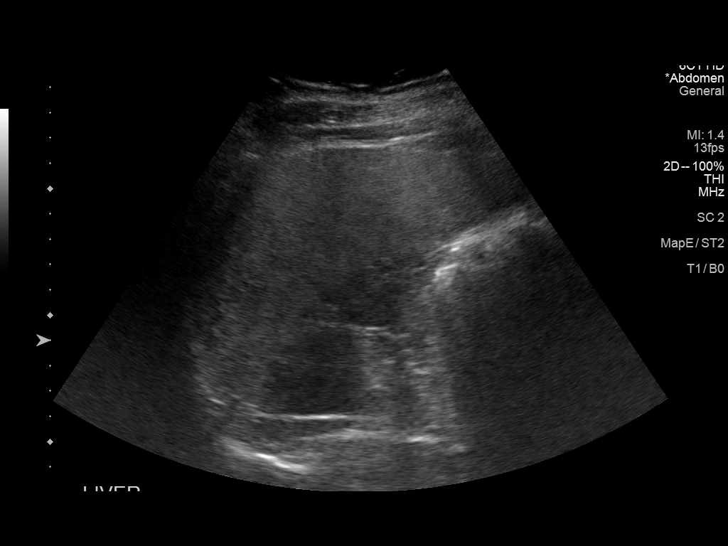
[im 26/33]
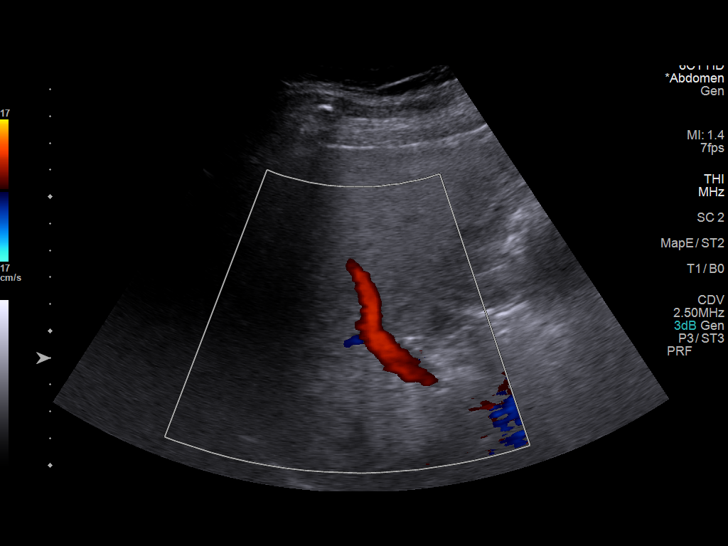
[im 29/33]
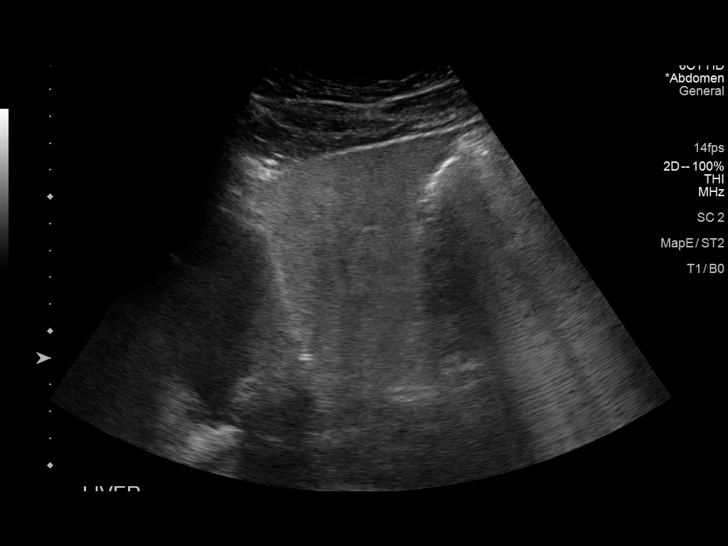
[im 31/33]
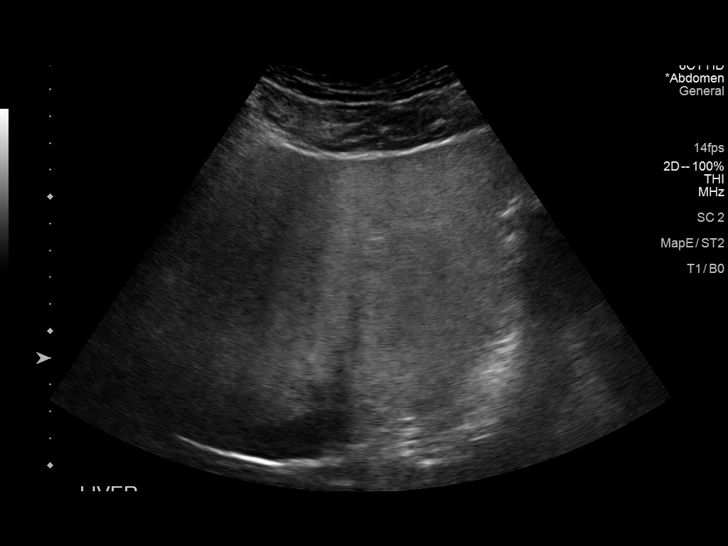

[12 of 25 positions shown; findings below may reference images not displayed]

FINDINGS: ULTRASOUND ABDOMEN LIMITED RIGHT UPPER QUADRANT

Gallbladder:

Status post cholecystectomy.

Common bile duct:

Diameter: 3.2 mm

Liver:

Heterogeneous and increased echotexture. No mass identified. Portal
vein is patent on color Doppler imaging with normal direction of
blood flow towards the liver.

ULTRASOUND HEPATIC ELASTOGRAPHY

Device: Siemens Helix VTQ

Patient position: Oblique

Transducer 6 C1

Number of measurements: 10

Hepatic segment:  8

Median kPa:

IQR:

IQR/Median kPa ratio:

Data quality:  Good

Diagnostic category:  ?5 kPa: high probability of being normal
IMPRESSION: ULTRASOUND RUQ:

1. Echogenic liver suggestive of hepatic steatosis.
2. Status post cholecystectomy.

ULTRASOUND HEPATIC ELASTOGRAPHY:

Median kPa:

Diagnostic category:  ?5 kPa: high probability of being normal

The use of hepatic elastography is applicable to patients with viral
hepatitis and non-alcoholic fatty liver disease. At this time, there
is insufficient data for the referenced cut-off values and use in
other causes of liver disease, including alcoholic liver disease.
Patients, however, may be assessed by elastography and serve as
their own reference standard/baseline.

In patients with non-alcoholic liver disease, the values suggesting
compensated advanced chronic liver disease (cACLD) may be lower, and
patients may need additional testing with elasticity results of [DATE]
kPa.

Please note that abnormal hepatic elasticity and shear wave
velocities may also be identified in clinical settings other than
with hepatic fibrosis, such as: acute hepatitis, elevated right
heart and central venous pressures including use of beta blockers,
Jd disease ([place]), infiltrative processes such as
mastocytosis/amyloidosis/infiltrative tumor/lymphoma, extrahepatic
cholestasis, with hyperemia in the post-prandial state, and with
liver transplantation. Correlation with patient history, laboratory
data, and clinical condition recommended.

## 2021-03-13 ENCOUNTER — Other Ambulatory Visit: Payer: Self-pay | Admitting: Internal Medicine

## 2021-03-13 DIAGNOSIS — I10 Essential (primary) hypertension: Secondary | ICD-10-CM

## 2021-03-13 DIAGNOSIS — F17200 Nicotine dependence, unspecified, uncomplicated: Secondary | ICD-10-CM

## 2021-04-16 ENCOUNTER — Ambulatory Visit
Admission: RE | Admit: 2021-04-16 | Discharge: 2021-04-16 | Disposition: A | Discharge status: Home / Self Care | Payer: No Typology Code available for payment source | Source: Ambulatory Visit | Attending: Internal Medicine | Admitting: Internal Medicine

## 2021-04-16 DIAGNOSIS — F17200 Nicotine dependence, unspecified, uncomplicated: Secondary | ICD-10-CM

## 2021-04-16 DIAGNOSIS — I10 Essential (primary) hypertension: Secondary | ICD-10-CM

## 2022-12-21 ENCOUNTER — Other Ambulatory Visit: Payer: Self-pay

## 2022-12-21 ENCOUNTER — Emergency Department (HOSPITAL_COMMUNITY)
Admission: EM | Admit: 2022-12-21 | Discharge: 2022-12-21 | Disposition: A | Discharge status: Home / Self Care | Payer: BC Managed Care – PPO | Attending: Emergency Medicine | Admitting: Emergency Medicine

## 2022-12-21 ENCOUNTER — Encounter (HOSPITAL_COMMUNITY): Payer: Self-pay

## 2022-12-21 DIAGNOSIS — R001 Bradycardia, unspecified: Secondary | ICD-10-CM | POA: Diagnosis not present

## 2022-12-21 DIAGNOSIS — I10 Essential (primary) hypertension: Secondary | ICD-10-CM | POA: Insufficient documentation

## 2022-12-21 DIAGNOSIS — R202 Paresthesia of skin: Secondary | ICD-10-CM | POA: Diagnosis present

## 2022-12-21 LAB — CBC
HCT: 44.2 % (ref 39.0–52.0)
Hemoglobin: 15.6 g/dL (ref 13.0–17.0)
MCH: 31.2 pg (ref 26.0–34.0)
MCHC: 35.3 g/dL (ref 30.0–36.0)
MCV: 88.4 fL (ref 80.0–100.0)
Platelets: 190 10*3/uL (ref 150–400)
RBC: 5 MIL/uL (ref 4.22–5.81)
RDW: 12.4 % (ref 11.5–15.5)
WBC: 6.1 10*3/uL (ref 4.0–10.5)
nRBC: 0 % (ref 0.0–0.2)

## 2022-12-21 LAB — BASIC METABOLIC PANEL
Anion gap: 8 (ref 5–15)
BUN: 8 mg/dL (ref 6–20)
CO2: 24 mmol/L (ref 22–32)
Calcium: 8.9 mg/dL (ref 8.9–10.3)
Chloride: 103 mmol/L (ref 98–111)
Creatinine, Ser: 0.91 mg/dL (ref 0.61–1.24)
GFR, Estimated: >60 mL/min (ref >60)
Glucose, Bld: 120 mg/dL — ABNORMAL HIGH (ref 70–99)
Potassium: 4.2 mmol/L (ref 3.5–5.1)
Sodium: 135 mmol/L (ref 135–145)

## 2022-12-21 LAB — MAGNESIUM: Magnesium: 1.8 mg/dL (ref 1.7–2.4)

## 2022-12-21 NOTE — ED Provider Notes (Signed)
[place] EMERGENCY DEPARTMENT AT [facility] Provider Note   CSN: [id number] Arrival date & time: 12/21/22  1243     History  No chief complaint on file.   [name] [name] IV is a 42 y.o. male with a PMH of HTN who presented to the ED for paresthesias.  Patient reports approximately 12 PM, he had an episode of full body numbness that started in his right arm and rapidly spread across his entire body including all 4 extremities.  States he felt unsteady on his feet, but denies focal weakness during this time.  Reports he called his wife to bring him to the ER.  He reports this episode ended on its own at approximately 3 PM and feels back to baseline now.  He denies headaches, blurry vision, chest pain, shortness of breath, abdominal pain, vomiting, or diarrhea throughout this time.  Denies recent head injury.  Denies similar history of episodes like this in the past.  Denies history of stroke, TIA.  Reports he went to a wedding in [place] over the weekend and thinks he may have drank too much, but denies any abnormalities that he can think of otherwise.  HPI     Home Medications Prior to Admission medications   Medication Sig Start Date End Date Taking? Authorizing Provider  cetirizine (ZYRTEC) 10 MG tablet Take 10 mg by mouth daily.    [provider]  dicyclomine (BENTYL) 20 MG tablet Take 1 tablet (20 mg total) by mouth every 8 (eight) hours as needed for spasms. 03/07/18   Benjiman Core, MD  HYDROcodone-acetaminophen (NORCO/VICODIN) 5-325 MG tablet Take 1 tablet by mouth every 6 (six) hours as needed for moderate pain. 12/01/17   Kinsinger, De Blanch, MD  ibuprofen (ADVIL,MOTRIN) 800 MG tablet Take 1 tablet (800 mg total) by mouth every 8 (eight) hours as needed. 12/01/17   Kinsinger, De Blanch, MD  promethazine (PHENERGAN) 25 MG tablet Take 1 tablet (25 mg total) by mouth every 6 (six) hours as needed for nausea. 03/07/18   Benjiman Core, MD  varenicline  (CHANTIX) 1 MG tablet Take 1 mg by mouth 2 (two) times daily.    [provider]      Allergies    Patient has no known allergies.    Review of Systems   Review of Systems  Physical Exam Updated Vital Signs BP (!) 152/88   Pulse 62   Temp 98 F (36.7 C) (Oral)   Resp 15   SpO2 99%  Physical Exam Constitutional:      General: He is not in acute distress.    Appearance: Normal appearance. He is not ill-appearing.  HENT:     Head: Normocephalic and atraumatic.     Nose: Nose normal.     Mouth/Throat:     Mouth: Mucous membranes are moist.     Pharynx: Oropharynx is clear.  Eyes:     Pupils: Pupils are equal, round, and reactive to light.  Cardiovascular:     Rate and Rhythm: Normal rate and regular rhythm.     Pulses: Normal pulses.     Heart sounds: Normal heart sounds. No murmur heard.    No friction rub. No gallop.  Pulmonary:     Effort: Pulmonary effort is normal.     Breath sounds: Normal breath sounds. No stridor. No wheezing, rhonchi or rales.  Abdominal:     Palpations: Abdomen is soft.     Tenderness: There is no abdominal tenderness. There is  no guarding or rebound.  Musculoskeletal:     Cervical back: Normal range of motion and neck supple.     Right lower leg: No edema.     Left lower leg: No edema.  Skin:    General: Skin is warm and dry.  Neurological:     General: No focal deficit present.     Mental Status: He is alert.     Comments: Cranial nerves II-XII intact.  Strength 5/5 and sensation intact in all 4 extremities.  Finger-to-nose with no dysmetria.  Gait stable without ataxia     ED Results / Procedures / Treatments   Labs (all labs ordered are listed, but only abnormal results are displayed) Labs Reviewed  BASIC METABOLIC PANEL - Abnormal; Notable for the following components:      Result Value   Glucose, Bld 120 (*)    All other components within normal limits  CBC  MAGNESIUM    EKG EKG Interpretation Date/Time:  Monday  December 21 2022 13:12:52 EST Ventricular Rate:  58 PR Interval:  156 QRS Duration:  86 QT Interval:  454 QTC Calculation: 445 R Axis:   21  Text Interpretation: Sinus bradycardia Nonspecific T wave abnormality Confirmed by Cathren Laine ([id number]) on 12/21/2022 5:33:58 PM  Radiology No results found.  Procedures Procedures    Medications Ordered in ED Medications - No data to display  ED Course/ Medical Decision Making/ A&P                                 Medical Decision Making  Vital signs stable, patient afebrile.  Physical exam overall very reassuring with no focal neurologic deficits.  CBC with no leukocytosis or anemia.  Metabolic panel with no gross metabolic or electrolyte abnormality.  Etiology of patient's symptoms is unclear.  Presentation does not appear consistent with CVA or TIA given full body distribution of his symptoms.  Additionally he denies syncope and the episode does not sound consistent with a seizure or syncopal episode.  Patient had no chest pain or shortness of breath concerning for cardiopulmonary etiology.  Additionally he is afebrile and has not had any infectious symptoms.  He had no headache during this time, picture not consistent with atypical migraine.  He has never had an episode of symptoms like this before, doubt multiple sclerosis or spinal cord pathology.  Obtaining a head CT was discussed with the patient, and he declined.  He reports he feels completely back to baseline.  I feel comfortable with discharge home and follow-up with his PCP.  Strict return precaution were discussed including further episodes of numbness or weakness, especially in any one extremity more than the others.  Patient voiced understanding.  He was discharged in stable condition.        Final Clinical Impression(s) / ED Diagnoses Final diagnoses:  Paresthesia    Rx / DC Orders ED Discharge Orders     None         Janyth Pupa, MD 12/21/22 2250     Cathren Laine, MD 12/22/22 [phone]

## 2022-12-21 NOTE — Discharge Instructions (Signed)
You were seen here today for an episode of full body numbness.  Your workup showed normal electrolytes, kidney function, and cell counts.  Please use all medications as prescribed.  Please follow-up with your PCP.  Please return to the emergency department for chest pain, shortness of breath, severe vomiting or inability to keep down food, loss of consciousness, or any worsening symptom or concern.

## 2022-12-21 NOTE — ED Triage Notes (Addendum)
Pt c/o R arm numbness that began at 1200 today that progressed to bilateral arm and leg numbness and bilateral weakness; states he feels "different", and "woozy"; denies pain, pt a and o x 4; ambulatory at triage; no unilateral weakness, equal grip strength, speaking in clear sentences

## 2023-01-11 ENCOUNTER — Ambulatory Visit: Payer: Self-pay | Attending: Cardiovascular Disease | Admitting: Cardiovascular Disease

## 2023-01-11 ENCOUNTER — Encounter: Payer: Self-pay | Admitting: Cardiovascular Disease

## 2023-01-11 VITALS — BP 145/82 | HR 74 | Ht 67.0 in | Wt 257.4 lb

## 2023-01-11 DIAGNOSIS — Z6841 Body Mass Index (BMI) 40.0 and over, adult: Secondary | ICD-10-CM | POA: Diagnosis not present

## 2023-01-11 DIAGNOSIS — R931 Abnormal findings on diagnostic imaging of heart and coronary circulation: Secondary | ICD-10-CM | POA: Diagnosis not present

## 2023-01-11 DIAGNOSIS — R0602 Shortness of breath: Secondary | ICD-10-CM | POA: Diagnosis not present

## 2023-01-11 DIAGNOSIS — E782 Mixed hyperlipidemia: Secondary | ICD-10-CM

## 2023-01-11 NOTE — Patient Instructions (Signed)
Testing/Procedures: Exercise Myoview Stress Test Your physician has requested that you have en exercise stress myoview. For further information please visit [email or web address] Please follow instruction sheet, as given.  Follow-Up: At [facility], you and your health needs are our priority.  As part of our continuing mission to provide you with exceptional heart care, we have created designated Provider Care Teams.  These Care Teams include your primary Cardiologist (physician) and Advanced Practice Providers (APPs -  Physician Assistants and Nurse Practitioners) who all work together to provide you with the care you need, when you need it.  Your next appointment:   6 month(s)  Provider:   Tonny Bollman, MD

## 2023-01-11 NOTE — Progress Notes (Signed)
Cardiology Office Note:    Date:  01/11/2023   ID:  [name] [name], DOB [birth year], MRN [id number]  PCP:  Alysia Penna, MD   [facility] Providers Cardiologist:  None     Referring MD: Alysia Penna, MD   Chief Complaint  Patient presents with   Shortness of Breath    History of Present Illness:    [name] [name] is a 43 y.o. male presenting for evaluation of elevated coronary calcium score.  The patient is here alone today.  In 2023 he underwent CT coronary calcium scoring and his calcium score is 28 which would put him in the 88th percentile for a 41 year old male.  There was no data for patients under 45, so his percentile would be even higher if that data were available.  He was also noted to have hepatic steatosis incidentally seen on that CT scan.  The patient has a history of fatty liver disease and he recently had an ALT of 196.  He had been placed on a low-dose of rosuvastatin for treatment of mixed hyperlipidemia in the setting of an elevated coronary calcium score.  However, after his recent labs showed elevated transaminases, his statin was discontinued.  The patient exercises sporadically.  He walks about 3 miles on a treadmill a few days per week.  He does some light weight lifting.  He reports episodes of exertional dyspnea with exercise.  He has no chest pain or chest pressure.  He has no heart palpitations, lightheadedness, leg edema, calf claudication, or syncope.  His father has an elevated coronary calcium score with no history of cardiac events.  His mother has no history of coronary artery disease.  There is no premature CAD in other family members.  The patient is a former smoker. He drinks alcohol most days, beer or wine, with an occasional bourbon.   Current Medications: Current Meds  Medication Sig   aspirin EC 81 MG tablet Take 81 mg by mouth daily.   Cholecalciferol 50 MCG (2000 UT) TABS Take 2,000 Units by mouth daily.   losartan  (COZAAR) 50 MG tablet Take 50 mg by mouth daily.   Magnesium 100 MG CAPS Take 100 mg by mouth daily.   varenicline (CHANTIX) 1 MG tablet Take 1 mg by mouth 2 (two) times daily.     Allergies:   Patient has no allergy information on record.   ROS:   Please see the history of present illness.    All other systems reviewed and are negative.  EKGs/Labs/Other Studies Reviewed:    The following studies were reviewed today:   Coronary CT: FINDINGS: CORONARY CALCIUM SCORES:   Left Main: 0   LAD: 9.2   LCx: 0.7   RCA: 18   Total Agatston Score: 27.9   MESA database percentile: Percentile data is not available for patients under the age of 44. A score of 104.82 in a 42 year old Caucasian male would be at the 88th percentile.   AORTA MEASUREMENTS:   Ascending Aorta: 31 mm   Descending Aorta: 25 mm   OTHER FINDINGS:   The heart size is within normal limits. No pericardial fluid is identified. Visualized segments of the thoracic aorta and central pulmonary arteries are normal in caliber. Visualized mediastinum and hilar regions demonstrate no lymphadenopathy or masses. Small hiatal hernia. Visualized lungs show no evidence of pulmonary edema, consolidation, pneumothorax, nodule or pleural fluid. Visualized liver demonstrates evidence steatosis. Visualized bony structures are unremarkable.   IMPRESSION: 1.  Coronary calcium score of 27.9. Percentile data is not available for patients under the age of 51. A score of 76.18 in a 42 year old Caucasian male would be at the 88th percentile. 2. Small hiatal hernia. 3. Hepatic steatosis.     EKG: Reviewed from ED visit 12/21/2022 and on my personal review which shows sinus bradycardia 58 bpm with nonspecific T wave abnormality      Recent Labs: 12/21/2022: BUN 8; Creatinine, Ser 0.91; Hemoglobin 15.6; Magnesium 1.8; Platelets 190; Potassium 4.2; Sodium 135  Recent Lipid Panel    Component Value Date/Time   CHOL 214 (H)  10/08/2014 0849   TRIG 182.0 (H) 10/08/2014 0849   HDL 42.80 10/08/2014 0849   CHOLHDL 5 10/08/2014 0849   VLDL 36.4 10/08/2014 0849   LDLCALC 135 (H) 10/08/2014 0849           Physical Exam:    VS:  BP (!) 145/82 (BP Location: Right Arm, Patient Position: Sitting, Cuff Size: Large)   Pulse 74   Ht 5\' 7"  (1.702 m)   Wt 257 lb 6.4 oz (116.8 kg)   SpO2 95%   BMI 40.31 kg/m     Wt Readings from Last 3 Encounters:  01/11/23 257 lb 6.4 oz (116.8 kg)  03/07/18 240 lb (108.9 kg)  03/05/18 240 lb (108.9 kg)     GEN:  Well nourished, well developed overweight male?  She is on dialysis in no acute distress HEENT: Normal NECK: No JVD; No carotid bruits LYMPHATICS: No lymphadenopathy CARDIAC: RRR, no murmurs, rubs, gallops RESPIRATORY:  Clear to auscultation without rales, wheezing or rhonchi  ABDOMEN: Soft, non-tender, non-distended MUSCULOSKELETAL:  No edema; No deformity  SKIN: Warm and dry NEUROLOGIC:  Alert and oriented x 3 PSYCHIATRIC:  Normal affect   Assessment & Plan Elevated coronary artery calcium score The patient has evidence of atherosclerotic cardiovascular disease with elevated calcium score at age 88.  We had extensive discussion about risk modification today, primarily aimed at establishing an exercise regimen with a goal of 30-40 minutes of exercise 5 days/week, nutritional counseling with discussion of portion control/calorie restriction, and reduction of alcohol. Shortness of breath With multiple CV risk factors including elevated coronary calcium score and fatty liver disease with evidence of steatohepatitis, recommend further evaluation with an exercise Myoview stress test to rule out ischemic heart disease as a cause of his shortness of breath. BMI 40.0-44.9, adult [facility]) See discussion above regarding lifestyle modification.  Patient plans on starting a GLP-1 through his primary care physician after the first of the year. Mixed hyperlipidemia Recent labs  reviewed with a cholesterol 163, HDL 35.  LDL in September 2023 was 84.  While the patient should be on a statin medication.  I do not think he should start this with such a high ALT recently checked at 196.  Again, he is working on lifestyle modification with alcohol cessation and weight loss.  He will have his labs rechecked by his primary care physician in a few months.  I will plan to see him back in 6 months after he has had a chance to be on his GLP-1 and make lifestyle changes as above.  Depending on his lipids at that time, he will need to be started on a statin medication or a PCSK9 inhibitor.  We discussed both options today and we will determine a treatment plan when he follows up.  Informed Consent   Shared Decision Making/Informed Consent The risks [chest pain, shortness of breath, cardiac arrhythmias, dizziness,  blood pressure fluctuations, myocardial infarction, stroke/transient ischemic attack, nausea, vomiting, allergic reaction, radiation exposure, metallic taste sensation and life-threatening complications (estimated to be 1 in 10,000)], benefits (risk stratification, diagnosing coronary artery disease, treatment guidance) and alternatives of a nuclear stress test were discussed in detail with Mr. Kauk and he agrees to proceed.              Medication Adjustments/Labs and Tests Ordered: Current medicines are reviewed at length with the patient today.  Concerns regarding medicines are outlined above.  Orders Placed This Encounter  Procedures   MYOCARDIAL PERFUSION IMAGING   No orders of the defined types were placed in this encounter.   Patient Instructions  Testing/Procedures: Exercise Myoview Stress Test Your physician has requested that you have en exercise stress myoview. For further information please visit [email or web address] Please follow instruction sheet, as given.  Follow-Up: At [facility], you and your health needs are our priority.  As part of our  continuing mission to provide you with exceptional heart care, we have created designated Provider Care Teams.  These Care Teams include your primary Cardiologist (physician) and Advanced Practice Providers (APPs -  Physician Assistants and Nurse Practitioners) who all work together to provide you with the care you need, when you need it.  Your next appointment:   6 month(s)  Provider:   Tonny Bollman, MD     Signed, Tonny Bollman, MD  01/11/2023 9:11 AM    [facility]

## 2023-01-13 ENCOUNTER — Encounter (HOSPITAL_COMMUNITY): Payer: Self-pay

## 2023-01-15 ENCOUNTER — Ambulatory Visit (HOSPITAL_COMMUNITY): Payer: BLUE CROSS/BLUE SHIELD | Attending: Cardiovascular Disease

## 2023-01-15 DIAGNOSIS — R931 Abnormal findings on diagnostic imaging of heart and coronary circulation: Secondary | ICD-10-CM

## 2023-01-15 LAB — MYOCARDIAL PERFUSION IMAGING
Angina Index: 0
Duke Treadmill Score: 11
Estimated workload: 13.4
Exercise duration (min): 11 min
Exercise duration (sec): 0 s
LV dias vol: 101 mL (ref 62–150)
LV sys vol: 35 mL
MPHR: 179 {beats}/min
Nuc Stress EF: 66 %
Peak HR: 173 {beats}/min
Percent HR: 96 %
Rest HR: 64 {beats}/min
Rest Nuclear Isotope Dose: 10.1 mCi
SDS: 0
SRS: 0
SSS: 0
ST Depression (mm): 0 mm
Stress Nuclear Isotope Dose: 30.7 mCi
TID: 0.79

## 2023-01-15 MED ORDER — TECHNETIUM TC 99M TETROFOSMIN IV KIT
10.1000 | PACK | Freq: Once | INTRAVENOUS | Status: AC | PRN
  Administered 2023-01-15: 10.1 via INTRAVENOUS

## 2023-01-15 MED ORDER — TECHNETIUM TC 99M TETROFOSMIN IV KIT
30.7000 | PACK | Freq: Once | INTRAVENOUS | Status: AC | PRN
  Administered 2023-01-15: 30.7 via INTRAVENOUS

## 2023-07-22 ENCOUNTER — Ambulatory Visit: Payer: BC Managed Care – PPO | Admitting: Cardiovascular Disease

## 2023-07-29 ENCOUNTER — Encounter: Payer: Self-pay | Admitting: *Deleted

## 2023-08-02 ENCOUNTER — Encounter: Payer: Self-pay | Admitting: Neurology

## 2023-08-02 ENCOUNTER — Ambulatory Visit: Admitting: Neurology

## 2023-08-02 VITALS — BP 132/78 | HR 65 | Ht 67.0 in | Wt 261.0 lb

## 2023-08-02 DIAGNOSIS — R0683 Snoring: Secondary | ICD-10-CM

## 2023-08-02 DIAGNOSIS — Z820 Family history of epilepsy and other diseases of the nervous system: Secondary | ICD-10-CM

## 2023-08-02 DIAGNOSIS — G4719 Other hypersomnia: Secondary | ICD-10-CM | POA: Diagnosis not present

## 2023-08-02 DIAGNOSIS — Z9189 Other specified personal risk factors, not elsewhere classified: Secondary | ICD-10-CM

## 2023-08-02 DIAGNOSIS — R0681 Apnea, not elsewhere classified: Secondary | ICD-10-CM

## 2023-08-02 NOTE — Patient Instructions (Signed)

## 2023-08-02 NOTE — Progress Notes (Deleted)
 Pt stated--excessive daytime sleepiness and fatigue--and per wife stop breathing when sleep.

## 2023-08-02 NOTE — Progress Notes (Signed)
 Subjective:    Patient ID: [name] [name] is a 43 y.o. male.  HPI    True Mar, MD, PhD [facility] [address] [address] [place], [place] [address]  Dear Dr. Larnell,  I saw your patient, [name] [name], upon your kind request in my sleep clinic today for initial consultation of his sleep disorder, in particular, concern for underlying obstructive sleep apnea.  The patient is unaccompanied today.  As you know, [name] [name] is a 43 year old male with an underlying medical history of migraine headaches, allergies, asthma, history of mononucleosis, history of prostatitis, steatohepatitis, gallbladder polyp, hepatic cyst, depression, and obesity, who reports snoring and excessive daytime somnolence.  His Epworth sleepiness score is 12 out of 24, fatigue severity score is 33 out of 63.  I reviewed your office note from 05/28/2023.  His father has sleep apnea and recently received a PAP machine.  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  He lives with his family including wife and 2 children, ages 88 and 67.  They currently no longer have any pets in the household.  He goes to bed generally around midnight and rise time is around 7:30 AM.  He has no nightly nocturia and denies recurrent nocturnal morning headaches.  His wife has noticed apneic pauses while he is asleep at times.  He is working on weight loss.  Weight has been more or less stable in the past few years.  He drinks caffeine in the form of coffee, about 2 cups in the mornings, alcohol a few times a week, quit smoking over 10 years ago.  His Past Medical History Is Significant For: Past Medical History:  Diagnosis Date   Asthma    mild, no inhaler used   Depression    Fatty liver 08/10/2017   Gallbladder polyp 08/10/2017   Hepatic cyst 08/10/2017   Small right hepatic cyst of 9 mm    Migraine    once none since   Mononucleosis    Obesity    Prostatitis, acute  Spring '13   resolved   Seasonal allergies    Stye    R eye   Unspecified chronic bronchitis (HCC)    long resolved. ? related to smoking    His Past Surgical History Is Significant For: Past Surgical History:  Procedure Laterality Date   CHOLECYSTECTOMY N/A 12/01/2017   Procedure: LAPAROSCOPIC CHOLECYSTECTOMY ERAS PATHWAY;  Surgeon: Kinsinger, Herlene Righter, MD;  Location: [facility];  Service: General;  Laterality: N/A;   hand laceration repair Left 01/14/2013   stitches   mole excision     one on arm and one on scalp   WISDOM TOOTH EXTRACTION      His Family History Is Significant For: Family History  Problem Relation Age of Onset   Crohn's disease Mother    Arrhythmia Mother    Asthma Father    COPD Father    Sleep apnea Father    Hypertension Paternal Grandfather    CAD Paternal Grandfather    Alcoholism Paternal Grandfather     His Social History Is Significant For: Social History   Socioeconomic History   Marital status: Married    Spouse name: Not on file   Number of children: 2   Years of education: 15   Highest education level: Not on file  Occupational History   Occupation: property developmen/construction  Tobacco Use   Smoking status: Former  Current packs/day: 1.00    Average packs/day: 1 pack/day for 15.0 years (15.0 ttl pk-yrs)    Types: Cigarettes   Smokeless tobacco: Never   Tobacco comments:    quit august 2019  Vaping Use   Vaping status: Never Used  Substance and Sexual Activity   Alcohol use: Yes    Comment: occ   Drug use: No   Sexual activity: Yes    Partners: Female  Other Topics Concern   Not on file  Social History Narrative   [employer]- 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 QUIT SMOKING MARCH 2010 but RESUMED   Fun: Build stuff and make stuff   Denies religious beleifs effecting health care.    Social Drivers of  Corporate investment banker Strain: Not on file  Food Insecurity: Not on file  Transportation Needs: Not on file  Physical Activity: Not on file  Stress: Not on file  Social Connections: Not on file    His Allergies Are:  No Known Allergies:   His Current Medications Are:  Outpatient Encounter Medications as of 08/02/2023  Medication Sig   aspirin EC 81 MG tablet Take 81 mg by mouth daily.   cetirizine (ZYRTEC) 10 MG tablet Take 10 mg by mouth daily.   Cholecalciferol 50 MCG (2000 UT) TABS Take 2,000 Units by mouth daily.   losartan (COZAAR) 50 MG tablet Take 50 mg by mouth daily.   Magnesium 100 MG CAPS Take 100 mg by mouth daily.   rosuvastatin (CRESTOR) 5 MG tablet Take 5 mg by mouth every evening.   varenicline  (CHANTIX ) 1 MG tablet Take 1 mg by mouth 2 (two) times daily.   No facility-administered encounter medications on file as of 08/02/2023.  :   Review of Systems:  Out of a complete 14 point review of systems, all are reviewed and negative with the exception of these symptoms as listed below:   Review of Systems  Neurological:        Pt stated--excessive daytime sleepiness and fatigue--and per wife stop breathing when sleep. ESS-12    Objective:  Neurological Exam  Physical Exam Physical Examination:   Vitals:   08/02/23 1022 08/02/23 1027  BP: (!) 149/88 132/78  Pulse: 60 65    General Examination: The patient is a very pleasant 43 y.o. male in no acute distress. He appears well-developed and well-nourished and well groomed.   HEENT: Normocephalic, atraumatic, pupils are equal, round and reactive to light, extraocular tracking is good without limitation to gaze excursion or nystagmus noted. Hearing is grossly intact. Face is symmetric with normal facial animation. Speech is clear with no dysarthria noted. There is no hypophonia. There is no lip, neck/head, jaw or voice tremor. Neck is supple with full range of passive and active motion. There are no carotid  bruits on auscultation. Oropharynx exam reveals: mild mouth dryness, good dental hygiene and marked airway crowding, due to small airway entry, thicker soft palate, Mallampati class [name], tonsillar size about 3+, larger uvula noted, tongue protrudes centrally and palate elevates symmetrically, neck circumference 18-7/8 inches, mild to moderate overbite noted.  Chest: Clear to auscultation without wheezing, rhonchi or crackles noted.  Heart: S1+S2+0, regular and normal without murmurs, rubs or gallops noted.   Abdomen: Soft, non-tender and non-distended.  Extremities: There is no pitting edema in the distal lower extremities bilaterally.   Skin: Warm and dry without trophic changes  noted.   Musculoskeletal: exam reveals no obvious joint deformities.   Neurologically:  Mental status: The patient is awake, alert and oriented in all 4 spheres. His immediate and remote memory, attention, language skills and fund of knowledge are appropriate. There is no evidence of aphasia, agnosia, apraxia or anomia. Speech is clear with normal prosody and enunciation. Thought process is linear. Mood is normal and affect is normal.  Cranial nerves II - XII are as described above under HEENT exam.  Motor exam: Normal bulk, moving all 4 extremities without restriction, no obvious action or resting tremor.  Fine motor skills and coordination: grossly intact.  Cerebellar testing: No dysmetria or intention tremor. There is no truncal or gait ataxia.  Sensory exam: intact to light touch in the upper and lower extremities.  Gait, station and balance: He stands easily. No veering to one side is noted. No leaning to one side is noted. Posture is age-appropriate and stance is narrow based. Gait shows normal stride length and normal pace. No problems turning are noted.   Assessment and Plan:   In summary, [name] [name] is a very pleasant 43 y.o.-year old male with an underlying medical history of migraine headaches,  allergies, asthma, history of mononucleosis, history of prostatitis, steatohepatitis, gallbladder polyp, hepatic cyst, depression, and obesity, whose history and physical exam are concerning for sleep disordered breathing, particularly obstructive sleep apnea (OSA).  While a laboratory attended sleep study is typically considered gold standard for evaluation of sleep disordered breathing, we mutually agreed to proceed with a home sleep test at this time.   I had a long chat with the patient about my findings and the diagnosis of sleep apnea, particularly OSA, its prognosis and treatment options. We talked about medical/conservative treatments, surgical interventions and non-pharmacological approaches for symptom control. I explained, in particular, the risks and ramifications of untreated moderate to severe OSA, especially with respect to developing cardiovascular disease down the road, including congestive heart failure (CHF), difficult to treat hypertension, cardiac arrhythmias (particularly A-fib), neurovascular complications including TIA, stroke and dementia. Even type 2 diabetes has, in part, been linked to untreated OSA. Symptoms of untreated OSA may include (but may not be limited to) daytime sleepiness, nocturia (i.e. frequent nighttime urination), memory problems, mood irritability and suboptimally controlled or worsening mood disorder such as depression and/or anxiety, lack of energy, lack of motivation, physical discomfort, as well as recurrent headaches, especially morning or nocturnal headaches. We talked about the importance of maintaining a healthy lifestyle and striving for healthy weight.  I recommended a sleep study at this time. I outlined the differences between a laboratory attended sleep study which is considered more comprehensive and accurate over the option of a home sleep test (HST); the latter may lead to underestimation of sleep disordered breathing in some instances and does not  help with diagnosing upper airway resistance syndrome and is not accurate enough to diagnose primary central sleep apnea typically. I outlined possible surgical and non-surgical treatment options of OSA, including the use of a positive airway pressure (PAP) device (i.e. CPAP, AutoPAP/APAP or BiPAP in certain circumstances), a custom-made dental device (aka oral appliance, which would require a referral to a specialist dentist or orthodontist typically, and is generally speaking not considered for patients with full dentures or edentulous state), upper airway surgical options, such as traditional UPPP (which is not considered a first-line treatment) or the Inspire device (hypoglossal nerve stimulator, which would involve a referral for consultation with an ENT surgeon,  after careful selection, following inclusion criteria - also not first-line treatment). I explained the PAP treatment option to the patient in detail, as this is generally considered first-line treatment.  The patient indicated that he would be willing to try PAP therapy, if the need arises. I explained the importance of being compliant with PAP treatment, not only for insurance purposes but primarily to improve patient's symptoms symptoms, and for the patient's long term health benefit, including to reduce His cardiovascular risks longer-term.    We will pick up our discussion about the next steps and treatment options after testing.  We will keep him posted as to the test results by phone call and/or MyChart messaging where possible.  We will plan to follow-up in sleep clinic accordingly as well.  I answered all his questions today and the patient was in agreement.   I encouraged him to call with any interim questions, concerns, problems or updates or email us  through MyChart.  Generally speaking, sleep test authorizations may take up to 2 weeks, sometimes less, sometimes longer, the patient is encouraged to get in touch with us  if they do not  hear back from the sleep lab staff directly within the next 2 weeks.  Thank you very much for allowing me to participate in the care of this nice patient. If I can be of any further assistance to you please do not hesitate to call me at [phone].  Sincerely,   True Mar, MD, PhD

## 2023-08-13 NOTE — Progress Notes (Signed)
 Cardiology Office Note   Date:  08/16/2023  ID:  [name] [name], DOB 03/10/1980, MRN [id number] PCP: [name] Hamilton, MD  [facility] Providers Cardiologist:  None {  History of Present Illness [name] [name] [name] is a 43 y.o. male with a past medical history of elevated coronary calcium  score and shortness of breath here for follow-up appointment.  Was last seen by [name] [name] in December of last year.  History includes in 2023 underwent CT coronary calcium  scoring and calcium  score was 28 which put him at the 80th percentile for a 43 year old male.  There were no data for patients under 45 so his percentile would be even higher if that data were available.  He was also noted to have hepatic steatosis incidentally seen on that CT scan.  Had a history of fatty liver disease and recently had an ALT of 196.  Placed on low-dose rosuvastatin  for treatment of mixed hyperlipidemia in the setting of elevated coronary calcium  score.  However, given his recent labs his statin was discontinued.  Patient exercises sporadically.  Walks about 3 miles on the treadmill a few days per week.  He does some light weightlifting.  Episodes of exertional dyspnea with exercise.  He has no chest pain or pressure.  No heart palpitations, lightheadedness, leg edema, calf claudication, or syncope.  His father has an elevated coronary calcium  score with no history of cardiac events.  Mother has no history of coronary artery disease.  No premature CAD in any other family members.  Patient is a former smoker.  He drinks alcohol most days, beer or wine with an occasional bourbon.  A stress test was ordered which was normal and low risk.  No ST changes.  Today, he  presents for follow-up after a previous cardiac evaluation.   In November, he experienced numbness in his arms and feet, but no significant findings were noted, and a stress test was normal. He has not had similar episodes since. An elevated calcium  score  in 2023 led to the initiation of statin therapy with Crestor  5 mg. His most recent LDL is 136 mg/dL. He has no family history of coronary artery disease.  He is a former smoker and consumes alcohol occasionally. He has a stressful job, which may contribute to slightly elevated blood pressure readings, typically around 130/70 mmHg. He is on Cozaar  50 mg for blood pressure management and takes a baby aspirin daily. He has not experienced chest pain or shortness of breath during exertion recently.  He snores and is awaiting further evaluation for suspected sleep apnea.   Reports no shortness of breath nor dyspnea on exertion. Reports no chest pain, pressure, or tightness. No edema, orthopnea, PND. Reports no palpitations.   Discussed the use of AI scribe software for clinical note transcription with the patient, who gave verbal consent to proceed.   ROS: Pertinent ROS in HPI  Studies Reviewed     Lexi scan Myoview  12/2022    The study is normal. The study is low risk.   No ST deviation was noted.   LV perfusion is normal. There is no evidence of ischemia. There is no evidence of infarction.   Left ventricular function is normal. Nuclear stress EF: 66%. The left ventricular ejection fraction is hyperdynamic (>65%). End diastolic cavity size is normal. End systolic cavity size is normal.   Prior study not available for comparison.  Physical Exam VS:  BP (!) 158/78 (BP Location: Right Arm, Patient Position: Sitting,  Cuff Size: Large)   Pulse 96   Ht 5' 7 (1.702 m)   Wt 264 lb (119.7 kg)   SpO2 97%   BMI 41.35 kg/m        Wt Readings from Last 3 Encounters:  08/16/23 264 lb (119.7 kg)  08/02/23 261 lb (118.4 kg)  01/15/23 257 lb (116.6 kg)    GEN: Well nourished, well developed in no acute distress NECK: No JVD; No carotid bruits CARDIAC: RRR, soft systolic murmur 1/6, rubs, gallops RESPIRATORY:  Clear to auscultation without rales, wheezing or rhonchi  ABDOMEN: Soft, non-tender,  non-distended EXTREMITIES:  No edema; No deformity   ASSESSMENT AND PLAN  Elevated LDL cholesterol LDL remains elevated at 136 mg/dL despite low-dose statin. Target LDL is <70 mg/dL due to high calcium  score. - Increase Crestor  to 10 mg daily. - Repeat fasting lipid panel and LFTs in 8-12 weeks.  Hypertension Blood pressure typically 130/70 mmHg at home. Stress may cause occasional elevations. - Continue Cozaar  50 mg daily. - Increase medication if blood pressure consistently exceeds 150 mmHg.  Slight heart murmur Detected slight heart murmur likely due to mild turbulent flow. No symptoms or urgent need for echocardiogram. - Note presence of slight heart murmur for future reference. -asymptomatic   SOB  -stable and about the same -no further evaluation needed at this time     Dispo: He can follow-up in 6 months with [name] [name]  Signed, [name] [name]

## 2023-08-16 ENCOUNTER — Ambulatory Visit: Payer: Self-pay | Attending: Physician Assistant | Admitting: Physician Assistant

## 2023-08-16 ENCOUNTER — Ambulatory Visit: Payer: Self-pay | Admitting: Physician Assistant

## 2023-08-16 ENCOUNTER — Encounter: Payer: Self-pay | Admitting: Physician Assistant

## 2023-08-16 VITALS — BP 158/78 | HR 96 | Ht 67.0 in | Wt 264.0 lb

## 2023-08-16 DIAGNOSIS — Z79899 Other long term (current) drug therapy: Secondary | ICD-10-CM

## 2023-08-16 DIAGNOSIS — R0602 Shortness of breath: Secondary | ICD-10-CM | POA: Diagnosis not present

## 2023-08-16 DIAGNOSIS — R931 Abnormal findings on diagnostic imaging of heart and coronary circulation: Secondary | ICD-10-CM | POA: Diagnosis not present

## 2023-08-16 DIAGNOSIS — Z6841 Body Mass Index (BMI) 40.0 and over, adult: Secondary | ICD-10-CM | POA: Diagnosis not present

## 2023-08-16 DIAGNOSIS — E782 Mixed hyperlipidemia: Secondary | ICD-10-CM | POA: Diagnosis not present

## 2023-08-16 MED ORDER — ROSUVASTATIN CALCIUM 10 MG PO CPSP
10.0000 mg | ORAL_CAPSULE | Freq: Every evening | ORAL | 3 refills | Status: AC
Start: 2023-08-16 — End: ?

## 2023-08-16 MED ORDER — LOSARTAN POTASSIUM 50 MG PO TABS: 50.0000 mg | ORAL_TABLET | Freq: Every day | ORAL | 3 refills | Status: AC

## 2023-08-16 NOTE — Patient Instructions (Signed)
 Medication Instructions:  INCRESASE CRESTOR  TO 10 MG DAILY *If you need a refill on your cardiac medications before your next appointment, please call your pharmacy*  Lab Work: FASTING LIPID PANEL AND LFT IN 8 WEEKS If you have labs (blood work) drawn today and your tests are completely normal, you will receive your results only by: MyChart Message (if you have MyChart) OR A paper copy in the mail If you have any lab test that is abnormal or we need to change your treatment, we will call you to review the results.  Testing/Procedures: NO TESTING  Follow-Up: At [facility], you and your health needs are our priority.  As part of our continuing mission to provide you with exceptional heart care, our providers are all part of one team.  This team includes your primary Cardiologist (physician) and Advanced Practice Providers or APPs (Physician Assistants and Nurse Practitioners) who all work together to provide you with the care you need, when you need it.  Your next appointment:   6 month(s)  Provider:   Ozell Fell, MD

## 2023-08-20 ENCOUNTER — Ambulatory Visit (INDEPENDENT_AMBULATORY_CARE_PROVIDER_SITE_OTHER): Admitting: Neurology

## 2023-08-20 DIAGNOSIS — R0681 Apnea, not elsewhere classified: Secondary | ICD-10-CM

## 2023-08-20 DIAGNOSIS — R0683 Snoring: Secondary | ICD-10-CM

## 2023-08-20 DIAGNOSIS — G4719 Other hypersomnia: Secondary | ICD-10-CM

## 2023-08-20 DIAGNOSIS — Z9189 Other specified personal risk factors, not elsewhere classified: Secondary | ICD-10-CM

## 2023-08-20 DIAGNOSIS — Z820 Family history of epilepsy and other diseases of the nervous system: Secondary | ICD-10-CM

## 2023-08-20 DIAGNOSIS — G4733 Obstructive sleep apnea (adult) (pediatric): Secondary | ICD-10-CM | POA: Diagnosis not present

## 2023-09-09 NOTE — Progress Notes (Signed)
 See procedure note.

## 2023-09-10 ENCOUNTER — Ambulatory Visit: Payer: Self-pay | Admitting: Neurology

## 2023-09-10 DIAGNOSIS — G4733 Obstructive sleep apnea (adult) (pediatric): Secondary | ICD-10-CM

## 2023-09-10 NOTE — Procedures (Signed)
 [facility]  HOME SLEEP TEST (SANSA) REPORT (Mail-Out Device):   STUDY DATE: 08/24/2023  DOB: 1980/05/07  MRN: [id number]  ORDERING CLINICIAN: True Mar, MD, PhD   REFERRING CLINICIAN: Larnell Hamilton, MD   CLINICAL INFORMATION/HISTORY: 43 year old male with an underlying medical history of migraine headaches, allergies, asthma, history of mononucleosis, history of prostatitis, steatohepatitis, gallbladder polyp, hepatic cyst, depression, and obesity, who reports snoring and excessive daytime somnolence.   PATIENT'S LAST REPORTED EPWORTH SLEEPINESS SCORE (ESS): 12/24.  BMI (at the time of sleep clinic visit and/or test date): 40.9 kg/m  FINDINGS:   Study Protocol:    The SANSA single-point-of-skin-contact chest-worn sensor - an FDA cleared and DOT approved type 4 home sleep test device - measures eight physiological channels,  including blood oxygen saturation (measured via PPG [photoplethysmography]), EKG-derived heart rate, respiratory effort, chest movement (measured via accelerometer), snoring, body position, and actigraphy. The device is designed to be worn for up to 10 hours per study.   Sleep Summary:   Total Recording Time (hours, min): 8 hours, 57 min  Total Effective Sleep Time (hours, min):  6 hours, 9 min  Sleep Efficiency (%):    77%   Respiratory Indices:   Calculated sAHI (per hour):  42.3/hour         Oxygen Saturation Statistics:    Oxygen Saturation (%) Mean: 93.9%   Minimum oxygen saturation (%):                 80.1%   O2 Saturation Range (%): 80.1-100%   Time below or at 88% saturation: 5 min   Pulse Rate Statistics:   Pulse Mean (bpm):    54/min    Pulse Range (39-83/min)   Snoring: Mild to loud  IMPRESSION/DIAGNOSES:   OSA (obstructive sleep apnea), severe   RECOMMENDATIONS:   This home sleep test demonstrates severe obstructive sleep apnea with a total AHI of 42.3/hour and O2 nadir of 80.1%. Snoring was  detected, in the mild to loud range.  Treatment with positive airway pressure is highly recommended. The patient will be advised to proceed with an autoPAP titration/trial at home. A laboratory attended titration study can be considered in the future for optimization of treatment settings and to improve tolerance and compliance, if needed, down the road. Alternative treatment options are limited secondary to the severity of the patient's sleep disordered breathing, but may include surgical treatment with an implantable hypoglossal nerve stimulator (in carefully selected candidates, meeting criteria).  Concomitant weight loss is recommended (where clinically appropriate). Please note, that untreated obstructive sleep apnea may carry additional perioperative morbidity. Patients with significant obstructive sleep apnea should receive perioperative PAP therapy and the surgeons and particularly the anesthesiologist should be informed of the diagnosis and the severity of the sleep disordered breathing. The patient should be cautioned not to drive, work at heights, or operate dangerous or heavy equipment when tired or sleepy. Review and reiteration of good sleep hygiene measures should be pursued with any patient. Other causes of the patient's symptoms, including circadian rhythm disturbances, an underlying mood disorder, medication effect and/or an underlying medical problem cannot be ruled out based on this test. Clinical correlation is recommended.  The patient and his referring provider will be notified of the test results. The patient will be seen in follow up in sleep clinic at [facility].  I certify that I have reviewed the raw data recording prior to the issuance of this report in accordance with the standards of the American Academy  of Sleep Medicine (AASM).    INTERPRETING PHYSICIAN:   True Mar, MD, PhD Medical Director, [facility]) Diplomat, ABPN (Neurology and  Sleep)   [facility] [address] [place], [place] [address] [phone]

## 2023-09-13 NOTE — Telephone Encounter (Addendum)
 Spoke to patient gave sleep study results Pt aware urgent set up due to severe OSA Pt chose [facility] for DME Pt aware of insurance compliance. Gave patient [facility] # Pt has f/u visit with Sarah,NP 11/01/2023 Sent orders to [facility] and forward sleep study results to PCP Pt expressed understanding and thanked me for calling

## 2023-09-13 NOTE — Telephone Encounter (Signed)
 LMVM for pt to return call.

## 2023-09-13 NOTE — Telephone Encounter (Signed)
 Pt has called for results to sleep study

## 2023-09-13 NOTE — Telephone Encounter (Signed)
-----   Message from [name] [name] sent at 09/10/2023 12:38 PM EDT ----- Urgent set up requested on PAP therapy, due to severe OSA.   Patient referred by PCP, seen by me on 08/02/2023, patient had a HST on 08/24/2023.    Please call and notify the patient that the recent home sleep test showed obstructive sleep apnea in the severe range. I recommend treatment for this in the form of autoPAP, which means, that we  don't have to bring him in for a sleep study with CPAP, but will let him start using a so called autoPAP machine at home, through a DME company (of his choice, or as per insurance requirement). The  DME representative will fit the patient with a mask of choice, educate him on how to use the machine, how to put the mask on, etc. I have placed an order in the chart. Please send the order to a  local DME, talk to patient, send report to referring MD. Please also reinforce the need for compliance with treatment. We will need a FU in sleep clinic for 10 weeks post-PAP set up, please arrange  that with me or one of our NPs. Thanks,   [name] Mar, MD, PhD [facility])    ----- Message ----- From: [name], MD Sent: 09/10/2023  12:36 PM EDT To: [name] Mar, MD

## 2023-11-01 ENCOUNTER — Encounter: Admitting: Neurology

## 2023-11-03 ENCOUNTER — Encounter: Admitting: Neurology

## 2023-11-30 NOTE — Progress Notes (Unsigned)
 Patient: [name] [name] Date of Birth: 11-27-1980  Reason for Visit: Follow up History from: Patient Primary Neurologist: Buck  ASSESSMENT AND PLAN 43 y.o. year old male   1.  OSA on CPAP  - Recommend nightly CPAP use minimum 4 hours - Continue current settings with nasal pillow mask - Has noted significant benefit, motivated to continue CPAP - Continue to replace supplies routinely through DME - Is working on weight loss, has been prescribed Mounjaro - Follow-up in 1 year virtually or sooner if needed  We reviewed HST 08/24/2023 showed severe OSA with a total AHI of 42.3/hour and O2 nadir of 80.1%.  Started CPAP 09/29/2023. Reviewed CPAP compliance report.   HISTORY OF PRESENT ILLNESS: Today 12/01/23 Saw Dr. Buck in July 2025 reporting snoring and excessive daytime somnolence.  HST 08/24/2023 showed severe OSA with a total AHI of 42.3/hour and O2 nadir of 80.1%.  Started CPAP 09/29/2023.  CPAP report shows 67% compliance, greater than 4 hours 63%.  Often times he may fall asleep without putting the CPAP on. 7-14 cm water.  AHI 0.4, leak 3.6.  Using nasal pillow mask.  Significant benefit from CPAP, more restorative sleep, less tired during the day, more energy.  Motivated to continue CPAP.  Has recently been prescribed Mounjaro.  Works as a management consultant. ESS 4.  HISTORY  08/02/23 Dr. Buck: I saw your patient, [name] [name], upon your kind request in my sleep clinic today for initial consultation of his sleep disorder, in particular, concern for underlying obstructive sleep apnea.  The patient is unaccompanied today.  As you know, [name] [name] is a 43 year old male with an underlying medical history of migraine headaches, allergies, asthma, history of mononucleosis, history of prostatitis, steatohepatitis, gallbladder polyp, hepatic cyst, depression, and obesity, who reports snoring and excessive daytime somnolence.  His Epworth sleepiness score is 12 out of 24, fatigue severity score  is 33 out of 63.  I reviewed your office note from 05/28/2023.  His father has sleep apnea and recently received a PAP machine.  Patient works as a management consultant, he has his own civil service fast streamer.  He lives with his family including wife and 2 children, ages 4 and 67.  They currently no longer have any pets in the household.  He goes to bed generally around midnight and rise time is around 7:30 AM.  He has no nightly nocturia and denies recurrent nocturnal morning headaches.  His wife has noticed apneic pauses while he is asleep at times.  He is working on weight loss.  Weight has been more or less stable in the past few years.  He drinks caffeine in the form of coffee, about 2 cups in the mornings, alcohol a few times a week, quit smoking over 10 years ago.   REVIEW OF SYSTEMS: Out of a complete 14 system review of symptoms, the patient complains only of the following symptoms, and all other reviewed systems are negative.  See HPI  ALLERGIES: No Known Allergies  HOME MEDICATIONS: Outpatient Medications Prior to Visit  Medication Sig Dispense Refill   aspirin EC 81 MG tablet Take 81 mg by mouth daily.     cetirizine (ZYRTEC) 10 MG tablet Take 10 mg by mouth daily.     Cholecalciferol 50 MCG (2000 UT) TABS Take 2,000 Units by mouth daily.     losartan  (COZAAR ) 50 MG tablet Take 1 tablet (50 mg total) by mouth daily. 90 tablet 3   Magnesium 100 MG CAPS  Take 100 mg by mouth daily.     MOUNJARO 2.5 MG/0.5ML Pen as directed Subcutaneous weekly     Rosuvastatin  Calcium  10 MG CPSP Take 10 mg by mouth every evening. 90 capsule 3   varenicline  (CHANTIX ) 1 MG tablet Take 1 mg by mouth 2 (two) times daily.     No facility-administered medications prior to visit.    PAST MEDICAL HISTORY: Past Medical History:  Diagnosis Date   Asthma    mild, no inhaler used   Depression    Fatty liver 08/10/2017   Gallbladder polyp 08/10/2017   Hepatic cyst 08/10/2017   Small right hepatic cyst of 9 mm     Migraine    once none since   Mononucleosis    Obesity    Prostatitis, acute Spring '13   resolved   Seasonal allergies    Stye    R eye   Unspecified chronic bronchitis (HCC)    long resolved. ? related to smoking    PAST SURGICAL HISTORY: Past Surgical History:  Procedure Laterality Date   CHOLECYSTECTOMY N/A 12/01/2017   Procedure: LAPAROSCOPIC CHOLECYSTECTOMY ERAS PATHWAY;  Surgeon: Kinsinger, Herlene Righter, MD;  Location: [facility];  Service: General;  Laterality: N/A;   hand laceration repair Left 01/14/2013   stitches   mole excision     one on arm and one on scalp   WISDOM TOOTH EXTRACTION      FAMILY HISTORY: Family History  Problem Relation Age of Onset   Crohn's disease Mother    Arrhythmia Mother    Asthma Father    COPD Father    Sleep apnea Father    Hypertension Paternal Grandfather    CAD Paternal Grandfather    Alcoholism Paternal Grandfather     SOCIAL HISTORY: Social History   Socioeconomic History   Marital status: Married    Spouse name: Not on file   Number of children: 2   Years of education: 15   Highest education level: Not on file  Occupational History   Occupation: property developmen/construction  Tobacco Use   Smoking status: Former    Current packs/day: 1.00    Average packs/day: 1 pack/day for 15.0 years (15.0 ttl pk-yrs)    Types: Cigarettes   Smokeless tobacco: Never   Tobacco comments:    quit august 2019  Vaping Use   Vaping status: Never Used  Substance and Sexual Activity   Alcohol use: Yes    Comment: occ   Drug use: No   Sexual activity: Yes    Partners: Female  Other Topics Concern   Not on file  Social History Narrative   [employer]- 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 QUIT SMOKING MARCH 2010 but RESUMED   Fun: Build stuff and make stuff   Denies religious beleifs effecting health care.    Social  Drivers of Corporate Investment Banker Strain: Not on file  Food Insecurity: Not on file  Transportation Needs: Not on file  Physical Activity: Not on file  Stress: Not on file  Social Connections: Not on file  Intimate Partner Violence: Not on file    PHYSICAL EXAM  Vitals:   12/01/23 1239  BP: 138/83  Pulse: 79  Weight: 264 lb (119.7 kg)  Height: 5' 7 (1.702 m)   Body mass index is 41.35 kg/m.  Generalized: Well developed, in no acute distress  Neurological examination  Mentation: Alert oriented to time, place, history taking. Follows all commands speech and language fluent Cranial nerve II-XII: Pupils were equal round reactive to light. Extraocular movements were full, visual field were full on confrontational test. Facial sensation and strength were normal.  Head turning and shoulder shrug  were normal and symmetric. Motor: Moves all extremities independent Gait and station: Gait is normal.  DIAGNOSTIC DATA (LABS, IMAGING, TESTING) - I reviewed patient records, labs, notes, testing and imaging myself where available.  Lab Results  Component Value Date   WBC 6.1 12/21/2022   HGB 15.6 12/21/2022   HCT 44.2 12/21/2022   MCV 88.4 12/21/2022   PLT 190 12/21/2022      Component Value Date/Time   NA 135 12/21/2022 1308   K 4.2 12/21/2022 1308   CL 103 12/21/2022 1308   CO2 24 12/21/2022 1308   GLUCOSE 120 (H) 12/21/2022 1308   BUN 8 12/21/2022 1308   CREATININE 0.91 12/21/2022 1308   CALCIUM  8.9 12/21/2022 1308   PROT 7.6 03/07/2018 0640   ALBUMIN 4.4 03/07/2018 0640   AST 56 (H) 03/07/2018 0640   ALT 106 (H) 03/07/2018 0640   ALKPHOS 67 03/07/2018 0640   BILITOT 1.9 (H) 03/07/2018 0640   GFRNONAA >60 12/21/2022 1308   GFRAA >60 03/07/2018 0640   Lab Results  Component Value Date   CHOL 214 (H) 10/08/2014   HDL 42.80 10/08/2014   LDLCALC 135 (H) 10/08/2014   TRIG 182.0 (H) 10/08/2014   CHOLHDL 5 10/08/2014   No results found for: HGBA1C No results  found for: VITAMINB12 Lab Results  Component Value Date   TSH 2.00 10/08/2014    Lauraine Born, AGNP-C, DNP 12/01/2023, 1:09 PM [facility] [address] [place], [place] [address] [phone]

## 2023-12-01 ENCOUNTER — Ambulatory Visit: Admitting: Neurology

## 2023-12-01 ENCOUNTER — Encounter: Payer: Self-pay | Admitting: Neurology

## 2023-12-01 VITALS — BP 138/83 | HR 79 | Ht 67.0 in | Wt 264.0 lb

## 2023-12-01 DIAGNOSIS — G4733 Obstructive sleep apnea (adult) (pediatric): Secondary | ICD-10-CM | POA: Diagnosis not present

## 2023-12-01 NOTE — Patient Instructions (Signed)
 Great to see you today! Continue CPAP usage minimum 4 hours nightly Continue current settings Continue to replace supplies routinely through DME Follow-up in 1 year or sooner if needed. Thanks!!

## 2024-02-03 ENCOUNTER — Encounter: Payer: Self-pay | Admitting: Cardiovascular Disease

## 2024-12-06 ENCOUNTER — Telehealth: Admitting: Neurology
